# Patient Record
Sex: Female | Born: 1985 | Race: White | Hispanic: No | Smoking: Current every day smoker
Health system: Southern US, Community
[De-identification: ages and names within clinical notes are randomized; demographics above are authoritative.]

## PROBLEM LIST (undated history)

## (undated) DIAGNOSIS — K8020 Calculus of gallbladder without cholecystitis without obstruction: Secondary | ICD-10-CM

## (undated) DIAGNOSIS — F32A Depression, unspecified: Secondary | ICD-10-CM

## (undated) DIAGNOSIS — K219 Gastro-esophageal reflux disease without esophagitis: Secondary | ICD-10-CM

## (undated) DIAGNOSIS — F329 Major depressive disorder, single episode, unspecified: Secondary | ICD-10-CM

## (undated) DIAGNOSIS — L409 Psoriasis, unspecified: Secondary | ICD-10-CM

## (undated) HISTORY — PX: MOUTH SURGERY: SHX715

## (undated) HISTORY — DX: Psoriasis, unspecified: L40.9

---

## 2016-08-09 DIAGNOSIS — K8020 Calculus of gallbladder without cholecystitis without obstruction: Secondary | ICD-10-CM

## 2016-08-09 HISTORY — DX: Calculus of gallbladder without cholecystitis without obstruction: K80.20

## 2016-08-24 ENCOUNTER — Emergency Department: Payer: Managed Care, Other (non HMO)

## 2016-08-24 ENCOUNTER — Emergency Department
Admission: EM | Admit: 2016-08-24 | Discharge: 2016-08-24 | Disposition: A | Discharge status: Home / Self Care | Payer: Managed Care, Other (non HMO) | Attending: Emergency Medicine | Admitting: Emergency Medicine

## 2016-08-24 ENCOUNTER — Encounter: Payer: Self-pay | Admitting: Emergency Medicine

## 2016-08-24 DIAGNOSIS — R1011 Right upper quadrant pain: Secondary | ICD-10-CM | POA: Diagnosis not present

## 2016-08-24 DIAGNOSIS — K8080 Other cholelithiasis without obstruction: Secondary | ICD-10-CM | POA: Diagnosis not present

## 2016-08-24 DIAGNOSIS — M549 Dorsalgia, unspecified: Secondary | ICD-10-CM

## 2016-08-24 DIAGNOSIS — K8020 Calculus of gallbladder without cholecystitis without obstruction: Secondary | ICD-10-CM

## 2016-08-24 DIAGNOSIS — F17200 Nicotine dependence, unspecified, uncomplicated: Secondary | ICD-10-CM | POA: Insufficient documentation

## 2016-08-24 DIAGNOSIS — R1010 Upper abdominal pain, unspecified: Secondary | ICD-10-CM | POA: Diagnosis present

## 2016-08-24 LAB — POCT PREGNANCY, URINE: Preg Test, Ur: NEGATIVE

## 2016-08-24 LAB — CBC
HCT: 43.1 % (ref 35.0–47.0)
HEMOGLOBIN: 14.6 g/dL (ref 12.0–16.0)
MCH: 30.4 pg (ref 26.0–34.0)
MCHC: 33.8 g/dL (ref 32.0–36.0)
MCV: 89.8 fL (ref 80.0–100.0)
Platelets: 359 10*3/uL (ref 150–440)
RBC: 4.8 MIL/uL (ref 3.80–5.20)
RDW: 13.1 % (ref 11.5–14.5)
WBC: 11.9 10*3/uL — AB (ref 3.6–11.0)

## 2016-08-24 LAB — HEPATIC FUNCTION PANEL
ALBUMIN: 4.3 g/dL (ref 3.5–5.0)
ALT: 30 U/L (ref 14–54)
AST: 20 U/L (ref 15–41)
Alkaline Phosphatase: 56 U/L (ref 38–126)
Bilirubin, Direct: <0.1 mg/dL — ABNORMAL LOW (ref 0.1–0.5)
TOTAL PROTEIN: 7.4 g/dL (ref 6.5–8.1)
Total Bilirubin: 0.4 mg/dL (ref 0.3–1.2)

## 2016-08-24 LAB — BASIC METABOLIC PANEL
ANION GAP: 8 (ref 5–15)
BUN: 11 mg/dL (ref 6–20)
CALCIUM: 9.5 mg/dL (ref 8.9–10.3)
CO2: 25 mmol/L (ref 22–32)
CREATININE: 0.76 mg/dL (ref 0.44–1.00)
Chloride: 104 mmol/L (ref 101–111)
GFR calc non Af Amer: >60 mL/min (ref >60)
Glucose, Bld: 126 mg/dL — ABNORMAL HIGH (ref 65–99)
Potassium: 3.3 mmol/L — ABNORMAL LOW (ref 3.5–5.1)
SODIUM: 137 mmol/L (ref 135–145)

## 2016-08-24 LAB — URINALYSIS, COMPLETE (UACMP) WITH MICROSCOPIC
BILIRUBIN URINE: NEGATIVE
Glucose, UA: NEGATIVE mg/dL
KETONES UR: NEGATIVE mg/dL
NITRITE: NEGATIVE
PH: 5 (ref 5.0–8.0)
PROTEIN: 30 mg/dL — AB
Specific Gravity, Urine: 1.023 (ref 1.005–1.030)

## 2016-08-24 LAB — TROPONIN I

## 2016-08-24 LAB — LIPASE, BLOOD: LIPASE: 28 U/L (ref 11–51)

## 2016-08-24 MED ORDER — MORPHINE SULFATE (PF) 4 MG/ML IV SOLN
4.0000 mg | Freq: Once | INTRAVENOUS | Status: AC
  Administered 2016-08-24: 4 mg via INTRAVENOUS
  Filled 2016-08-24: qty 1

## 2016-08-24 MED ORDER — SODIUM CHLORIDE 0.9 % IV BOLUS (SEPSIS)
1000.0000 mL | Freq: Once | INTRAVENOUS | Status: AC
  Administered 2016-08-24: 1000 mL via INTRAVENOUS

## 2016-08-24 MED ORDER — ONDANSETRON HCL 4 MG/2ML IJ SOLN
4.0000 mg | Freq: Once | INTRAMUSCULAR | Status: AC
  Administered 2016-08-24: 4 mg via INTRAVENOUS

## 2016-08-24 MED ORDER — ONDANSETRON HCL 4 MG PO TABS
4.0000 mg | ORAL_TABLET | Freq: Once | ORAL | Status: DC
  Filled 2016-08-24: qty 1

## 2016-08-24 NOTE — ED Notes (Signed)
Pt given water and crackers  

## 2016-08-24 NOTE — ED Triage Notes (Signed)
Pt presents ambulatory to ED with c/o chest pain which started around 8 months ago and has been increasing in frequency since that time. Pt denies other cardiac symptoms. Pt is in NAD at this time.

## 2016-08-24 NOTE — ED Provider Notes (Addendum)
[facility] Emergency Department Provider Note  ____________________________________________   First MD Initiated Contact with Patient 08/24/16 [phone]     (approximate)  I have reviewed the triage vital signs and the nursing notes.   HISTORY  Chief Complaint Chest Pain   HPI Leslie Moyer is a 31 y.o. female without any chronic medical conditions was presenting to the emergency department today with upper abdominal pain radiating into her chest and back. She says the pain is severe and sharp and constant over the past 2 hours. She says the pain is been off and on over the past 8 months. She has tried Tums as well as Protonix without relief. Says that the pain sometimes does radiate up into her chest and cause shortness of breath. However, there are no exertional symptoms and the patient says the pain may come on at any time. No nausea or vomiting. Patient says that it does not worsen after eating. Denies any cardiac history or history in her family of cardiac disease at young ages. The patient does smoke but denies any drinking or drug use.   History reviewed. No pertinent past medical history.  There are no active problems to display for this patient.   History reviewed. No pertinent surgical history.  Prior to Admission medications   Not on File    Allergies Patient has no known allergies.  No family history on file.  Social History Social History  Substance Use Topics  . Smoking status: Current Every Day Smoker    Packs/day: 0.50  . Smokeless tobacco: Never Used  . Alcohol use Yes     Comment: rarely    Review of Systems  Constitutional: No fever/chills Eyes: No visual changes. ENT: No sore throat. Cardiovascular: as above Respiratory: as above Gastrointestinal:   No nausea, no vomiting.  No diarrhea.  No constipation. Genitourinary: Negative for dysuria. Musculoskeletal: Negative for back pain. Skin: Negative for  rash. Neurological: Negative for headaches, focal weakness or numbness.   ____________________________________________   PHYSICAL EXAM:  VITAL SIGNS: ED Triage Vitals  Enc Vitals Group     BP 08/24/16 0656 (!) 149/110     Pulse Rate 08/24/16 0656 74     Resp 08/24/16 0656 18     Temp 08/24/16 0656 98.2 F (36.8 C)     Temp Source 08/24/16 0656 Oral     SpO2 08/24/16 0656 100 %     Weight 08/24/16 0652 210 lb (95.3 kg)     Height 08/24/16 0652 5\' 6"  (1.676 m)     Head Circumference --      Peak Flow --      Pain Score 08/24/16 0652 10     Pain Loc --      Pain Edu? --      Excl. in GC? --     Constitutional: Alert and oriented. Pt appears uncomfortable Eyes: Conjunctivae are normal.  Head: Atraumatic. Nose: No congestion/rhinnorhea. Mouth/Throat: Mucous membranes are moist.  Neck: No stridor.   Cardiovascular: Normal rate, regular rhythm. Grossly normal heart sounds.   Respiratory: Normal respiratory effort.  No retractions. Lungs CTAB. Gastrointestinal: Right upper quadrant tenderness palpation with a negative Murphy sign. Tenderness is moderate to severe. No distention.  Musculoskeletal: No lower extremity tenderness nor edema.  No joint effusions. Neurologic:  Normal speech and language. No gross focal neurologic deficits are appreciated. Skin:  Skin is warm, dry and intact. No rash noted. Psychiatric: Mood and affect are normal. Speech and behavior are  normal.  ____________________________________________   LABS (all labs ordered are listed, but only abnormal results are displayed)  Labs Reviewed  BASIC METABOLIC PANEL  CBC  TROPONIN I  LIPASE, BLOOD  HEPATIC FUNCTION PANEL  URINALYSIS, COMPLETE (UACMP) WITH MICROSCOPIC  POC URINE PREG, ED   ____________________________________________  EKG  ED ECG REPORT I, Norvell Ureste,  Teena Irani, the attending physician, personally viewed and interpreted this ECG.   Date: 08/24/2016  EKG Time: 0654  Rate: 77   Rhythm: normal sinus rhythm  Axis: normal  Intervals:none  ST&T Change: No ST segment elevation or depression. No abnormal T-wave inversion.  ____________________________________________  RADIOLOGY  No acute finding on the chest x-ray  Cholelithiasis on ultrasound. CAT scan without any nephrolithiasis. Found to have what appears to be a benign left adrenal adenoma. ____________________________________________   PROCEDURES  Procedure(s) performed:   Procedures  Critical Care performed:   ____________________________________________   INITIAL IMPRESSION / ASSESSMENT AND PLAN / ED COURSE  Pertinent labs & imaging results that were available during my care of the patient were reviewed by me and considered in my medical decision making (see chart for details).  ----------------------------------------- 11:43 AM on 08/24/2016 -----------------------------------------  Patient was evaluated by Dr. Tonita Cong who believes the patient is appropriate for follow-up in office and arrange for an appointment this Monday 10:30 AM. The patient is pain-free at this time. I was able to reevaluate her with only mild right upper quadrant tenderness palpation. She vomited 1 in the emergency Department but now, after morphine and Zofran and is able to tolerate by mouth fluids and solids. Patient will be discharged home at this time. Likely abdominal pain radiating to the chest. Unlikely to be PE or cardiac disease. Patient denies any urinary complaints including any frequency, burning or lower abdominal pain. We will send the urinalysis for culture. We will not give antibiotics at this time.      ____________________________________________   FINAL CLINICAL IMPRESSION(S) / ED DIAGNOSES  Symptomatic cholelithiasis.    NEW MEDICATIONS STARTED DURING THIS VISIT:  New Prescriptions   No medications on file     Note:  This document was prepared using Dragon voice recognition software and  may include unintentional dictation errors.     Myrna Blazer, MD 08/24/16 1144  Patient offered pain medications but does not want them. 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    Myrna Blazer, MD 08/24/16 1145

## 2016-08-24 NOTE — Consult Note (Signed)
Patient ID: Leslie Moyer, female   DOB: 10/07/1985, 31 y.o.   MRN: [id number]  CC: Abdominal pain  HPI Leslie HazelCaralie Counsell is a 31 y.o. female who presents to the emergency department for evaluation of abdominal pain. Patient reports over the last 8 months she's had intermittent pains in her midepigastric or right upper quadrant. It appears to be happening more frequently over the last month. However, the time of my consultation she states her pain has resolved. She had 1 episode of nausea and vomiting after coming to the hospital which she reports happened after receiving a dose of morphine. She reports no other nausea and vomiting outside of this. She denies any fevers, chills, chest pain, shortness breath, diarrhea, constipation. She cannot report of actual food trigger but reports that she had chicken Parmesan last night approximately 6 hours before the onset of pain. She describes the pain as a stabbing sensation that comes and goes. She is otherwise in her usual state of health and states something disease be done about this because it is getting more frequent.  HPI  Medical history: Currently on no medications  Surgical history: Prior oral surgery but no prior abdominal surgery  Family history: Remote family history of breast cancer but no first-degree relatives with any cancers, heart disease, diabetes.  Social History Social History  Substance Use Topics  . Smoking status: Current Every Day Smoker    Packs/day: 0.50  . Smokeless tobacco: Never Used  . Alcohol use Yes     Comment: rarely    No Known Allergies  No current facility-administered medications for this encounter.    Current Outpatient Prescriptions  Medication Sig Dispense Refill  . acetaminophen (TYLENOL) 500 MG tablet Take 500 mg by mouth every 6 (six) hours as needed.    . calcium carbonate (TUMS - DOSED IN MG ELEMENTAL CALCIUM) 500 MG chewable tablet Chew 1 tablet by mouth as needed for indigestion or heartburn.        Review of Systems A Multi-point review of systems was asked and was negative except for the findings documented in the history of present illness  Physical Exam Blood pressure 128/74, pulse 78, temperature 98.2 F (36.8 C), temperature source Oral, resp. rate 17, height 5\' 6"  (1.676 m), weight 95.3 kg (210 lb), last menstrual period 07/24/2016, SpO2 97 %. CONSTITUTIONAL: No acute distress. EYES: Pupils are equal, round, and reactive to light, Sclera are non-icteric. EARS, NOSE, MOUTH AND THROAT: The oropharynx is clear. The oral mucosa is pink and moist. Hearing is intact to voice. LYMPH NODES:  Lymph nodes in the neck are normal. RESPIRATORY:  Lungs are clear. There is normal respiratory effort, with equal breath sounds bilaterally, and without pathologic use of accessory muscles. CARDIOVASCULAR: Heart is regular without murmurs, gallops, or rubs. GI: The abdomen is soft, nontender, and nondistended. There are no palpable masses. There is no hepatosplenomegaly. There are normal bowel sounds in all quadrants. GU: Rectal deferred.   MUSCULOSKELETAL: Normal muscle strength and tone. No cyanosis or edema.   SKIN: Turgor is good and there are no pathologic skin lesions or ulcers. NEUROLOGIC: Motor and sensation is grossly normal. Cranial nerves are grossly intact. PSYCH:  Oriented to person, place and time. Affect is normal.  Data Reviewed Images and labs reviewed. Labs show a mild leukocytosis of 11.9 but are otherwise within normal limits including normal LFTs. Ultrasound the right upper quadrant shows evidence of cholelithiasis but no gallbladder wall thickening, pericholecystic fluid, ductal dilatation. CT scan of her  renal stone protocol shows a normal gallbladder as well as well as no evidence of kidney stones. I have personally reviewed the patient's imaging, laboratory findings and medical records.    Assessment    Symptomatic cholelithiasis    Plan    31 year old female  with symptomatic cholelithiasis. Per the history of her symptoms becoming more frequent I recommended close outpatient follow-up so that an elective laparoscopic cholecystectomy can be scheduled. Discussed the signs and symptoms of cholecystitis in detail and for her to return to the emergency department should her pain return without relief or should she no longer be able to maintain hydration at home. Otherwise we will see her in clinic on Monday morning with my partner Dr. Everlene Farrier for further evaluation and to schedule her outpatient cholecystectomy. The plan was discussed in detail with the ER physician we will give the patient a by mouth trial prior to her being discharged from the ER. Patient agrees with this plan.     Time spent with the patient was 45 minutes, with more than 50% of the time spent in face-to-face education, counseling and care coordination.     Ricarda Frame, MD FACS General Surgeon 08/24/2016, 10:28 AM

## 2016-08-26 ENCOUNTER — Telehealth: Payer: Self-pay | Admitting: Surgery

## 2016-08-26 ENCOUNTER — Ambulatory Visit (INDEPENDENT_AMBULATORY_CARE_PROVIDER_SITE_OTHER): Payer: Managed Care, Other (non HMO) | Admitting: Surgery

## 2016-08-26 ENCOUNTER — Encounter: Payer: Self-pay | Admitting: Surgery

## 2016-08-26 VITALS — BP 135/89 | HR 96 | Temp 97.8°F | Ht 66.0 in | Wt 220.4 lb

## 2016-08-26 DIAGNOSIS — K8020 Calculus of gallbladder without cholecystitis without obstruction: Secondary | ICD-10-CM

## 2016-08-26 LAB — URINE CULTURE

## 2016-08-26 NOTE — Telephone Encounter (Signed)
Pt advised of pre op date/time and sx date. Sx: 08/28/16 with Dr Pabon--Laparoscopic cholecystectomy.  Pre op: 08/27/16 between 1-5:00pm--Phone.   Patient made aware to call [phone], between 1-3:00pm the day before surgery, to find out what time to arrive.

## 2016-08-26 NOTE — Patient Instructions (Signed)
We have your surgery scheduled for 08/28/16 at [facility] with Dr.Pabon. Please see your blue pre-care sheet for surgery information. Please call our office if you have any questions or concerns.

## 2016-08-26 NOTE — Progress Notes (Signed)
Patient ID: Leslie Moyer, female   DOB: Jan 02, 1986, 31 y.o.   MRN: [id number]  HPI Linsey Arteaga is a 31 y.o. female with an 8 month hx of right upper quadrant pain nausea and vomiting. She reports that the pain is intermittent moderate in intensity. No evidence of jaundice or cholangitis. No fevers or chills. Pain seems to be worsening with heavy meals. No specific alleviating factors  Last acute attack was 2 days ago that prompted her to the emergency room and where ultrasound of the right upper quadrant was performed and I have personally reviewed, there is evidence of cholelithiasis without cholecystitis. Normal common bile duct. CT scan reviewed personally as well showing no acute intra-abdominal or myalgias other than an small adrenal adenoma. No previous abdominal operations. She is able to perform more than 4 Mets of activity without any shortness of breath or chest pain WBC slight elevation and nml LFTs  HPI  Past Medical History:  Diagnosis Date  . Psoriasis     Past Surgical History:  Procedure Laterality Date  . MOUTH SURGERY      Family History  Problem Relation Age of Onset  . Breast cancer Maternal Grandmother     Social History Social History  Substance Use Topics  . Smoking status: Current Every Day Smoker    Packs/day: 0.50  . Smokeless tobacco: Never Used  . Alcohol use Yes     Comment: rarely    No Known Allergies  No current outpatient prescriptions on file.   No current facility-administered medications for this visit.      Review of Systems Full ROS  was asked and was negative except for the information on the HPI  Physical Exam Blood pressure 135/89, pulse 96, temperature 97.8 F (36.6 C), temperature source Oral, height 5\' 6"  (1.676 m), weight 100 kg (220 lb 6.4 oz). CONSTITUTIONAL: NAD EYES: Pupils are equal, round, and reactive to light, Sclera are non-icteric. EARS, NOSE, MOUTH AND THROAT: The oropharynx is clear. The oral mucosa is  pink and moist. Hearing is intact to voice. LYMPH NODES:  Lymph nodes in the neck are normal. RESPIRATORY:  Lungs are clear. There is normal respiratory effort, with equal breath sounds bilaterally, and without pathologic use of accessory muscles. CARDIOVASCULAR: Heart is regular without murmurs, gallops, or rubs. GI: The abdomen is  soft, mild TTP RUQ, negative murphy. There is no hepatosplenomegaly. There are normal bowel sounds in all quadrants. GU: Rectal deferred.   MUSCULOSKELETAL: Normal muscle strength and tone. No cyanosis or edema.   SKIN: Turgor is good and there are no pathologic skin lesions or ulcers. NEUROLOGIC: Motor and sensation is grossly normal. Cranial nerves are grossly intact. PSYCH:  Oriented to person, place and time. Affect is normal.  Data Reviewed I have personally reviewed the patient's imaging, laboratory findings and medical records.    Assessment/Plan Symptomatic Cholelithiasis vs chronic cholecystitis. I do recommend cholecystectomy given her sxs. The risks, benefits, complications, treatment options, and expected outcomes were discussed with the patient. The possibilities of bleeding, recurrent infection, finding a normal gallbladder, perforation of viscus organs, damage to surrounding structures, bile leak, abscess formation, needing a drain placed, the need for additional procedures, reaction to medication, pulmonary aspiration,  failure to diagnose a condition, the possible need to convert to an open procedure, and creating a complication requiring transfusion or operation were discussed with the patient. The patient and/or family concurred with the proposed plan, giving informed consent.   Sterling Big, MD [facility] General Surgeon  08/26/2016, 11:01 AM

## 2016-08-27 ENCOUNTER — Encounter
Admission: RE | Admit: 2016-08-27 | Discharge: 2016-08-27 | Disposition: A | Discharge status: Home / Self Care | Payer: Managed Care, Other (non HMO) | Source: Ambulatory Visit | Attending: Surgery | Admitting: Surgery

## 2016-08-27 HISTORY — DX: Gastro-esophageal reflux disease without esophagitis: K21.9

## 2016-08-27 HISTORY — DX: Depression, unspecified: F32.A

## 2016-08-27 HISTORY — DX: Major depressive disorder, single episode, unspecified: F32.9

## 2016-08-27 NOTE — Patient Instructions (Signed)
  Your procedure is scheduled on: 08-28-16 Report to Same Day Surgery 2nd floor medical mall [facility](Medical Mall Entrance-take elevator on left to 2nd floor.  Check in with surgery information desk.) @ 11 AM PER PT   Remember: Instructions that are not followed completely may result in serious medical risk, up to and including death, or upon the discretion of your surgeon and anesthesiologist your surgery may need to be rescheduled.    _x___ 1. Do not eat food or drink liquids after midnight. No gum chewing or hard candies.     __x__ 2. No Alcohol for 24 hours before or after surgery.   __x__3. No Smoking for 24 prior to surgery.   ____  4. Bring all medications with you on the day of surgery if instructed.    __x__ 5. Notify your doctor if there is any change in your medical condition     (cold, fever, infections).     Do not wear jewelry, make-up, hairpins, clips or nail polish.  Do not wear lotions, powders, or perfumes. You may wear deodorant.  Do not shave 48 hours prior to surgery. Men may shave face and neck.  Do not bring valuables to the hospital.    [facility] is not responsible for any belongings or valuables.               Contacts, dentures or bridgework may not be worn into surgery.  Leave your suitcase in the car. After surgery it may be brought to your room.  For patients admitted to the hospital, discharge time is determined by your                       treatment team.   Patients discharged the day of surgery will not be allowed to drive home.  You will need someone to drive you home and stay with you the night of your procedure.    Please read over the following fact sheets that you were given:   [facility] Preparing for Surgery and or MRSA Information   ____ Take anti-hypertensive (unless it includes a diuretic), cardiac, seizure, asthma,     anti-reflux and psychiatric medicines. These include:  1. NONE  2.  3.  4.  5.  6.  ____Fleets enema or Magnesium Citrate  as directed.   ____ Use CHG Soap or sage wipes as directed on instruction sheet   ____ Use inhalers on the day of surgery and bring to hospital day of surgery  ____ Stop Metformin and Janumet 2 days prior to surgery.    ____ Take 1/2 of usual insulin dose the night before surgery and none on the morning     surgery.   ____ Follow recommendations from Cardiologist, Pulmonologist or PCP regarding  stopping Aspirin, Coumadin, Pllavix ,Eliquis, Effient, or Pradaxa, and Pletal.  X____Stop Anti-inflammatories such as Advil, Aleve, IBUPROFEN, Motrin, Naproxen, Naprosyn, Goodies powders or aspirin products NOW-OK to take Tylenol    ____ Stop supplements until after surgery   ____ Bring C-Pap to the hospital.

## 2016-08-28 ENCOUNTER — Ambulatory Visit: Payer: Managed Care, Other (non HMO) | Admitting: Anesthesiology

## 2016-08-28 ENCOUNTER — Encounter
Admission: RE | Disposition: A | Discharge status: Home / Self Care | Payer: Self-pay | Source: Ambulatory Visit | Attending: Surgery

## 2016-08-28 ENCOUNTER — Ambulatory Visit
Admission: RE | Admit: 2016-08-28 | Discharge: 2016-08-28 | Disposition: A | Discharge status: Home / Self Care | Payer: Managed Care, Other (non HMO) | Source: Ambulatory Visit | Attending: Surgery | Admitting: Surgery

## 2016-08-28 ENCOUNTER — Encounter: Payer: Self-pay | Admitting: *Deleted

## 2016-08-28 DIAGNOSIS — K8050 Calculus of bile duct without cholangitis or cholecystitis without obstruction: Secondary | ICD-10-CM | POA: Diagnosis present

## 2016-08-28 DIAGNOSIS — Z803 Family history of malignant neoplasm of breast: Secondary | ICD-10-CM | POA: Diagnosis not present

## 2016-08-28 DIAGNOSIS — F17210 Nicotine dependence, cigarettes, uncomplicated: Secondary | ICD-10-CM | POA: Insufficient documentation

## 2016-08-28 DIAGNOSIS — K8020 Calculus of gallbladder without cholecystitis without obstruction: Secondary | ICD-10-CM | POA: Insufficient documentation

## 2016-08-28 DIAGNOSIS — K8010 Calculus of gallbladder with chronic cholecystitis without obstruction: Secondary | ICD-10-CM | POA: Diagnosis not present

## 2016-08-28 HISTORY — PX: CHOLECYSTECTOMY: SHX55

## 2016-08-28 HISTORY — DX: Calculus of gallbladder without cholecystitis without obstruction: K80.20

## 2016-08-28 LAB — POCT I-STAT 4, (NA,K, GLUC, HGB,HCT)
Glucose, Bld: 100 mg/dL — ABNORMAL HIGH (ref 65–99)
HEMATOCRIT: 42 % (ref 36.0–46.0)
HEMOGLOBIN: 14.3 g/dL (ref 12.0–15.0)
POTASSIUM: 4 mmol/L (ref 3.5–5.1)
SODIUM: 140 mmol/L (ref 135–145)

## 2016-08-28 LAB — POCT PREGNANCY, URINE: Preg Test, Ur: NEGATIVE

## 2016-08-28 SURGERY — LAPAROSCOPIC CHOLECYSTECTOMY
Anesthesia: General | Wound class: Clean Contaminated

## 2016-08-28 MED ORDER — FENTANYL CITRATE (PF) 100 MCG/2ML IJ SOLN
INTRAMUSCULAR | Status: AC
  Filled 2016-08-28: qty 2

## 2016-08-28 MED ORDER — FAMOTIDINE 20 MG PO TABS
20.0000 mg | ORAL_TABLET | Freq: Once | ORAL | Status: AC
  Administered 2016-08-28: 20 mg via ORAL

## 2016-08-28 MED ORDER — SUGAMMADEX SODIUM 200 MG/2ML IV SOLN
INTRAVENOUS | Status: AC
Start: 2016-08-28 — End: ?
  Filled 2016-08-28: qty 2

## 2016-08-28 MED ORDER — HYDROCODONE-ACETAMINOPHEN 5-325 MG PO TABS
ORAL_TABLET | ORAL | Status: AC
  Filled 2016-08-28: qty 1

## 2016-08-28 MED ORDER — PROPOFOL 10 MG/ML IV BOLUS
INTRAVENOUS | Status: DC | PRN
  Administered 2016-08-28: 180 mg via INTRAVENOUS

## 2016-08-28 MED ORDER — HYDROCODONE-ACETAMINOPHEN 5-325 MG PO TABS
1.0000 | ORAL_TABLET | ORAL | Status: DC | PRN
  Administered 2016-08-28: 1 via ORAL

## 2016-08-28 MED ORDER — FENTANYL CITRATE (PF) 100 MCG/2ML IJ SOLN
INTRAMUSCULAR | Status: DC | PRN
  Administered 2016-08-28: 50 ug via INTRAVENOUS
  Administered 2016-08-28 (×2): 100 ug via INTRAVENOUS
  Administered 2016-08-28: 50 ug via INTRAVENOUS

## 2016-08-28 MED ORDER — MIDAZOLAM HCL 2 MG/2ML IJ SOLN
INTRAMUSCULAR | Status: DC | PRN
  Administered 2016-08-28: 2 mg via INTRAVENOUS

## 2016-08-28 MED ORDER — KETOROLAC TROMETHAMINE 30 MG/ML IJ SOLN
INTRAMUSCULAR | Status: DC | PRN
  Administered 2016-08-28: 30 mg via INTRAVENOUS

## 2016-08-28 MED ORDER — BUPIVACAINE-EPINEPHRINE 0.25% -1:200000 IJ SOLN
INTRAMUSCULAR | Status: DC | PRN
  Administered 2016-08-28: 30 mL

## 2016-08-28 MED ORDER — CHLORHEXIDINE GLUCONATE CLOTH 2 % EX PADS
6.0000 | MEDICATED_PAD | Freq: Once | CUTANEOUS | Status: AC
  Administered 2016-08-28: 6 via TOPICAL

## 2016-08-28 MED ORDER — ROCURONIUM BROMIDE 50 MG/5ML IV SOLN
INTRAVENOUS | Status: AC
  Filled 2016-08-28: qty 1

## 2016-08-28 MED ORDER — PROMETHAZINE HCL 25 MG/ML IJ SOLN: 6.2500 mg | INTRAMUSCULAR | Status: DC | PRN

## 2016-08-28 MED ORDER — DEXAMETHASONE SODIUM PHOSPHATE 10 MG/ML IJ SOLN
INTRAMUSCULAR | Status: AC
  Filled 2016-08-28: qty 1

## 2016-08-28 MED ORDER — LIDOCAINE HCL (CARDIAC) 20 MG/ML IV SOLN
INTRAVENOUS | Status: DC | PRN
  Administered 2016-08-28: 80 mg via INTRAVENOUS

## 2016-08-28 MED ORDER — CEFAZOLIN SODIUM-DEXTROSE 2-4 GM/100ML-% IV SOLN
INTRAVENOUS | Status: AC
  Filled 2016-08-28: qty 100

## 2016-08-28 MED ORDER — SODIUM CHLORIDE 0.9 % IJ SOLN
INTRAMUSCULAR | Status: AC
  Filled 2016-08-28: qty 50

## 2016-08-28 MED ORDER — ONDANSETRON HCL 4 MG/2ML IJ SOLN
INTRAMUSCULAR | Status: DC | PRN
Start: 2016-08-28 — End: 2016-08-28
  Administered 2016-08-28: 4 mg via INTRAVENOUS

## 2016-08-28 MED ORDER — DEXAMETHASONE SODIUM PHOSPHATE 10 MG/ML IJ SOLN
INTRAMUSCULAR | Status: DC | PRN
  Administered 2016-08-28: 10 mg via INTRAVENOUS

## 2016-08-28 MED ORDER — LACTATED RINGERS IV SOLN
INTRAVENOUS | Status: DC
  Administered 2016-08-28: 12:00:00 via INTRAVENOUS

## 2016-08-28 MED ORDER — HYDROCODONE-ACETAMINOPHEN 5-325 MG PO TABS: 1.0000 | ORAL_TABLET | ORAL | 0 refills | Status: AC | PRN

## 2016-08-28 MED ORDER — SUGAMMADEX SODIUM 200 MG/2ML IV SOLN
INTRAVENOUS | Status: DC | PRN
  Administered 2016-08-28: 200 mg via INTRAVENOUS

## 2016-08-28 MED ORDER — CHLORHEXIDINE GLUCONATE CLOTH 2 % EX PADS: 6.0000 | MEDICATED_PAD | Freq: Once | CUTANEOUS | Status: DC

## 2016-08-28 MED ORDER — ONDANSETRON HCL 4 MG/2ML IJ SOLN
INTRAMUSCULAR | Status: AC
  Filled 2016-08-28: qty 2

## 2016-08-28 MED ORDER — FAMOTIDINE 20 MG PO TABS
ORAL_TABLET | ORAL | Status: AC
  Filled 2016-08-28: qty 1

## 2016-08-28 MED ORDER — MIDAZOLAM HCL 2 MG/2ML IJ SOLN
INTRAMUSCULAR | Status: AC
  Filled 2016-08-28: qty 2

## 2016-08-28 MED ORDER — ROCURONIUM BROMIDE 100 MG/10ML IV SOLN
INTRAVENOUS | Status: DC | PRN
  Administered 2016-08-28: 40 mg via INTRAVENOUS

## 2016-08-28 MED ORDER — BUPIVACAINE-EPINEPHRINE (PF) 0.25% -1:200000 IJ SOLN
INTRAMUSCULAR | Status: AC
  Filled 2016-08-28: qty 30

## 2016-08-28 MED ORDER — FENTANYL CITRATE (PF) 100 MCG/2ML IJ SOLN
25.0000 ug | INTRAMUSCULAR | Status: DC | PRN
  Administered 2016-08-28 (×2): 50 ug via INTRAVENOUS

## 2016-08-28 MED ORDER — FENTANYL CITRATE (PF) 100 MCG/2ML IJ SOLN
INTRAMUSCULAR | Status: AC
  Administered 2016-08-28: 50 ug via INTRAVENOUS
  Filled 2016-08-28: qty 2

## 2016-08-28 MED ORDER — CEFAZOLIN SODIUM-DEXTROSE 2-4 GM/100ML-% IV SOLN
2.0000 g | INTRAVENOUS | Status: AC
  Administered 2016-08-28: 2 g via INTRAVENOUS

## 2016-08-28 MED ORDER — PROPOFOL 10 MG/ML IV BOLUS
INTRAVENOUS | Status: AC
  Filled 2016-08-28: qty 20

## 2016-08-28 SURGICAL SUPPLY — 47 items
APPLICATOR COTTON TIP 6IN STRL (MISCELLANEOUS) IMPLANT
APPLIER CLIP 5 13 M/L LIGAMAX5 (MISCELLANEOUS) ×3
BLADE SURG 15 STRL LF DISP TIS (BLADE) ×1 IMPLANT
BLADE SURG 15 STRL SS (BLADE) ×2
CANISTER SUCT 1200ML W/VALVE (MISCELLANEOUS) ×3 IMPLANT
CHLORAPREP W/TINT 26ML (MISCELLANEOUS) ×3 IMPLANT
CHOLANGIOGRAM CATH TAUT (CATHETERS) IMPLANT
CLEANER CAUTERY TIP 5X5 PAD (MISCELLANEOUS) ×1 IMPLANT
CLIP APPLIE 5 13 M/L LIGAMAX5 (MISCELLANEOUS) ×1 IMPLANT
DECANTER SPIKE VIAL GLASS SM (MISCELLANEOUS) ×3 IMPLANT
DERMABOND ADVANCED (GAUZE/BANDAGES/DRESSINGS) ×2
DERMABOND ADVANCED .7 DNX12 (GAUZE/BANDAGES/DRESSINGS) ×1 IMPLANT
DRAPE C-ARM XRAY 36X54 (DRAPES) IMPLANT
ELECT CAUTERY BLADE 6.4 (BLADE) ×3 IMPLANT
ELECT REM PT RETURN 9FT ADLT (ELECTROSURGICAL) ×3
ELECTRODE REM PT RTRN 9FT ADLT (ELECTROSURGICAL) ×1 IMPLANT
ENDOPOUCH RETRIEVER 10 (MISCELLANEOUS) ×3 IMPLANT
GLOVE BIO SURGEON STRL SZ7 (GLOVE) ×9 IMPLANT
GLOVE BIOGEL PI IND STRL 7.5 (GLOVE) ×3 IMPLANT
GLOVE BIOGEL PI INDICATOR 7.5 (GLOVE) ×6
GOWN STRL REUS W/ TWL LRG LVL3 (GOWN DISPOSABLE) ×3 IMPLANT
GOWN STRL REUS W/TWL LRG LVL3 (GOWN DISPOSABLE) ×6
IRRIGATION STRYKERFLOW (MISCELLANEOUS) ×1 IMPLANT
IRRIGATOR STRYKERFLOW (MISCELLANEOUS) ×3
IV CATH ANGIO 12GX3 LT BLUE (NEEDLE) IMPLANT
IV NS 1000ML (IV SOLUTION) ×2
IV NS 1000ML BAXH (IV SOLUTION) ×1 IMPLANT
L-HOOK LAP DISP 36CM (ELECTROSURGICAL) ×3
LHOOK LAP DISP 36CM (ELECTROSURGICAL) ×1 IMPLANT
NEEDLE HYPO 22GX1.5 SAFETY (NEEDLE) ×3 IMPLANT
PACK LAP CHOLECYSTECTOMY (MISCELLANEOUS) ×3 IMPLANT
PAD CLEANER CAUTERY TIP 5X5 (MISCELLANEOUS) ×2
PENCIL ELECTRO HAND CTR (MISCELLANEOUS) ×3 IMPLANT
SCISSORS METZENBAUM CVD 33 (INSTRUMENTS) ×3 IMPLANT
SLEEVE ENDOPATH XCEL 5M (ENDOMECHANICALS) ×6 IMPLANT
SOL ANTI-FOG 6CC FOG-OUT (MISCELLANEOUS) ×1 IMPLANT
SOL FOG-OUT ANTI-FOG 6CC (MISCELLANEOUS) ×2
SPONGE LAP 18X18 5 PK (GAUZE/BANDAGES/DRESSINGS) ×3 IMPLANT
STOPCOCK 4 WAY LG BORE MALE ST (IV SETS) IMPLANT
SUT ETHIBOND 0 MO6 C/R (SUTURE) IMPLANT
SUT MNCRL AB 4-0 PS2 18 (SUTURE) ×3 IMPLANT
SUT VICRYL 0 AB UR-6 (SUTURE) ×6 IMPLANT
SYR 20CC LL (SYRINGE) IMPLANT
TROCAR XCEL BLUNT TIP 100MML (ENDOMECHANICALS) ×3 IMPLANT
TROCAR XCEL NON-BLD 5MMX100MML (ENDOMECHANICALS) ×3 IMPLANT
TUBING INSUFFLATOR HI FLOW (MISCELLANEOUS) ×3 IMPLANT
WATER STERILE IRR 1000ML POUR (IV SOLUTION) ×3 IMPLANT

## 2016-08-28 NOTE — H&P (View-Only) (Signed)
Patient ID: Leslie Moyer, female   DOB: Jan 02, 1986, 31 y.o.   MRN: [id number]  HPI Leslie Moyer is a 31 y.o. female with an 8 month hx of right upper quadrant pain nausea and vomiting. She reports that the pain is intermittent moderate in intensity. No evidence of jaundice or cholangitis. No fevers or chills. Pain seems to be worsening with heavy meals. No specific alleviating factors  Last acute attack was 2 days ago that prompted her to the emergency room and where ultrasound of the right upper quadrant was performed and I have personally reviewed, there is evidence of cholelithiasis without cholecystitis. Normal common bile duct. CT scan reviewed personally as well showing no acute intra-abdominal or myalgias other than an small adrenal adenoma. No previous abdominal operations. She is able to perform more than 4 Mets of activity without any shortness of breath or chest pain WBC slight elevation and nml LFTs  HPI  Past Medical History:  Diagnosis Date  . Psoriasis     Past Surgical History:  Procedure Laterality Date  . MOUTH SURGERY      Family History  Problem Relation Age of Onset  . Breast cancer Maternal Grandmother     Social History Social History  Substance Use Topics  . Smoking status: Current Every Day Smoker    Packs/day: 0.50  . Smokeless tobacco: Never Used  . Alcohol use Yes     Comment: rarely    No Known Allergies  No current outpatient prescriptions on file.   No current facility-administered medications for this visit.      Review of Systems Full ROS  was asked and was negative except for the information on the HPI  Physical Exam Blood pressure 135/89, pulse 96, temperature 97.8 F (36.6 C), temperature source Oral, height 5\' 6"  (1.676 m), weight 100 kg (220 lb 6.4 oz). CONSTITUTIONAL: NAD EYES: Pupils are equal, round, and reactive to light, Sclera are non-icteric. EARS, NOSE, MOUTH AND THROAT: The oropharynx is clear. The oral mucosa is  pink and moist. Hearing is intact to voice. LYMPH NODES:  Lymph nodes in the neck are normal. RESPIRATORY:  Lungs are clear. There is normal respiratory effort, with equal breath sounds bilaterally, and without pathologic use of accessory muscles. CARDIOVASCULAR: Heart is regular without murmurs, gallops, or rubs. GI: The abdomen is  soft, mild TTP RUQ, negative murphy. There is no hepatosplenomegaly. There are normal bowel sounds in all quadrants. GU: Rectal deferred.   MUSCULOSKELETAL: Normal muscle strength and tone. No cyanosis or edema.   SKIN: Turgor is good and there are no pathologic skin lesions or ulcers. NEUROLOGIC: Motor and sensation is grossly normal. Cranial nerves are grossly intact. PSYCH:  Oriented to person, place and time. Affect is normal.  Data Reviewed I have personally reviewed the patient's imaging, laboratory findings and medical records.    Assessment/Plan Symptomatic Cholelithiasis vs chronic cholecystitis. I do recommend cholecystectomy given her sxs. The risks, benefits, complications, treatment options, and expected outcomes were discussed with the patient. The possibilities of bleeding, recurrent infection, finding a normal gallbladder, perforation of viscus organs, damage to surrounding structures, bile leak, abscess formation, needing a drain placed, the need for additional procedures, reaction to medication, pulmonary aspiration,  failure to diagnose a condition, the possible need to convert to an open procedure, and creating a complication requiring transfusion or operation were discussed with the patient. The patient and/or family concurred with the proposed plan, giving informed consent.   Sterling Big, MD [facility] General Surgeon  08/26/2016, 11:01 AM

## 2016-08-28 NOTE — Op Note (Signed)
Laparoscopic Cholecystectomy  Pre-operative Diagnosis: biliary colic  Post-operative Diagnosis: same  Procedure: lap chole  Surgeon: Sterling Bigiego Maytte Jacot, MD FACS  Anesthesia: Gen. with endotracheal tube  Findings: GS   Estimated Blood Loss: 10cc                 Specimens: Gallbladder           Complications: none   Procedure Details  The patient was seen again in the Holding Room. The benefits, complications, treatment options, and expected outcomes were discussed with the patient. The risks of bleeding, infection, recurrence of symptoms, failure to resolve symptoms, bile duct damage, bile duct leak, retained common bile duct stone, bowel injury, any of which could require further surgery and/or ERCP, stent, or papillotomy were reviewed with the patient. The likelihood of improving the patient's symptoms with return to their baseline status is good.  The patient and/or family concurred with the proposed plan, giving informed consent.  The patient was taken to Operating Room, identified as Tommye StandardCaralie G Friedli and the procedure verified as Laparoscopic Cholecystectomy.  A Time Out was held and the above information confirmed.  Prior to the induction of general anesthesia, antibiotic prophylaxis was administered. VTE prophylaxis was in place. General endotracheal anesthesia was then administered and tolerated well. After the induction, the abdomen was prepped with Chloraprep and draped in the sterile fashion. The patient was positioned in the supine position.  Cut down technique was used to enter the abdominal cavity and a Hasson trochar was placed after two vicryl stitches were anchored to the fascia. Pneumoperitoneum was then created with CO2 and tolerated well without any adverse changes in the patient's vital signs.  Three 5-mm ports were placed in the right upper quadrant all under direct vision. All skin incisions  were infiltrated with a local anesthetic agent before making the incision and  placing the trocars.   The patient was positioned  in reverse Trendelenburg, tilted slightly to the patient's left.  The gallbladder was identified, the fundus grasped and retracted cephalad. Adhesions were lysed bluntly. The infundibulum was grasped and retracted laterally, exposing the peritoneum overlying the triangle of Calot. This was then divided and exposed in a blunt fashion. An extended critical view of the cystic duct and cystic artery was obtained.  The cystic duct was clearly identified and bluntly dissected.   Artery and duct were double clipped and divided.  The gallbladder was taken from the gallbladder fossa in a retrograde fashion with the electrocautery. The gallbladder was removed and placed in an Endocatch bag. The liver bed was irrigated and inspected. Hemostasis was achieved with the electrocautery. Copious irrigation was utilized and was repeatedly aspirated until clear.  The gallbladder and Endocatch sac were then removed through a port site.    Inspection of the right upper quadrant was performed. No bleeding, bile duct injury or leak, or bowel injury was noted. Pneumoperitoneum was released.  The periumbilical port site was closed with interrumpted 0 Vicryl sutures. 4-0 subcuticular Monocryl was used to close the skin. Dermabond was  applied.  The patient was then extubated and brought to the recovery room in stable condition. Sponge, lap, and needle counts were correct at closure and at the conclusion of the case.               Sterling Bigiego Sherif Millspaugh, MD, FACS

## 2016-08-28 NOTE — Transfer of Care (Signed)
Immediate Anesthesia Transfer of Care Note  Patient: Leslie Moyer  Procedure(s) Performed: Procedure(s): LAPAROSCOPIC CHOLECYSTECTOMY (N/A)  Patient Location: PACU  Anesthesia Type:General  Level of Consciousness: awake, alert  and oriented  Airway & Oxygen Therapy: Patient Spontanous Breathing and Patient connected to face mask oxygen  Post-op Assessment: Report given to RN and Post -op Vital signs reviewed and stable  Post vital signs: Reviewed and stable  Last Vitals:  Vitals:   08/28/16 1116 08/28/16 1309  BP: 121/71 (!) 143/98  Pulse: 95 97  Resp: 14 17  Temp: 36.8 C 36.3 C    Last Pain:  Vitals:   08/28/16 1309  TempSrc:   PainSc: Asleep         Complications: No apparent anesthesia complications

## 2016-08-28 NOTE — Discharge Instructions (Signed)

## 2016-08-28 NOTE — Anesthesia Procedure Notes (Signed)
Procedure Name: Intubation Date/Time: 08/28/2016 12:12 PM Performed by: Hedda Slade Pre-anesthesia Checklist: Patient identified, Patient being monitored, Timeout performed, Emergency Drugs available and Suction available Patient Re-evaluated:Patient Re-evaluated prior to inductionOxygen Delivery Method: Circle system utilized Preoxygenation: Pre-oxygenation with 100% oxygen Intubation Type: IV induction Ventilation: Mask ventilation without difficulty Laryngoscope Size: Mac and 3 Grade View: Grade I Tube type: Oral Tube size: 7.0 mm Number of attempts: 1 Airway Equipment and Method: Stylet Placement Confirmation: ETT inserted through vocal cords under direct vision,  positive ETCO2 and breath sounds checked- equal and bilateral Secured at: 21 cm Tube secured with: Tape Dental Injury: Teeth and Oropharynx as per pre-operative assessment

## 2016-08-28 NOTE — Anesthesia Post-op Follow-up Note (Cosign Needed)
Anesthesia QCDR form completed.        

## 2016-08-28 NOTE — Anesthesia Preprocedure Evaluation (Signed)
Anesthesia Evaluation  Patient identified by MRN, date of birth, ID band Patient awake    Reviewed: Allergy & Precautions, H&P , NPO status , Patient's Chart, lab work & pertinent test results, reviewed documented beta blocker date and time   History of Anesthesia Complications Negative for: history of anesthetic complications  Airway Mallampati: II  TM Distance: >3 FB Neck ROM: full    Dental  (+) Edentulous Upper, Upper Dentures, Dental Advidsory Given   Pulmonary neg shortness of breath, neg sleep apnea, neg COPD, neg recent URI, Current Smoker,           Cardiovascular Exercise Tolerance: Good negative cardio ROS       Neuro/Psych PSYCHIATRIC DISORDERS (Depression) negative neurological ROS     GI/Hepatic Neg liver ROS, GERD  ,  Endo/Other  negative endocrine ROS  Renal/GU negative Renal ROS  negative genitourinary   Musculoskeletal   Abdominal   Peds  Hematology negative hematology ROS (+)   Anesthesia Other Findings Past Medical History: No date: Depression     Comment: X 1-WAS AN ISOLATED EVENT PER PT 08/2016: Gallstones No date: GERD (gastroesophageal reflux disease) No date: Psoriasis   Reproductive/Obstetrics negative OB ROS                             Anesthesia Physical Anesthesia Plan  ASA: II  Anesthesia Plan: General   Post-op Pain Management:    Induction: Intravenous  PONV Risk Score and Plan: 2 and Ondansetron and Dexamethasone  Airway Management Planned: Oral ETT  Additional Equipment:   Intra-op Plan:   Post-operative Plan: Extubation in OR  Informed Consent: I have reviewed the patients History and Physical, chart, labs and discussed the procedure including the risks, benefits and alternatives for the proposed anesthesia with the patient or authorized representative who has indicated his/her understanding and acceptance.   Dental Advisory  Given  Plan Discussed with: Anesthesiologist, CRNA and Surgeon  Anesthesia Plan Comments:         Anesthesia Quick Evaluation

## 2016-08-28 NOTE — Interval H&P Note (Signed)
History and Physical Interval Note:  08/28/2016 11:35 AM  Leslie Moyer  has presented today for surgery, with the diagnosis of CHOLELITHIASIS  The various methods of treatment have been discussed with the patient and family. After consideration of risks, benefits and other options for treatment, the patient has consented to  Procedure(s): LAPAROSCOPIC CHOLECYSTECTOMY (N/A) as a surgical intervention .  The patient's history has been reviewed, patient examined, no change in status, stable for surgery.  I have reviewed the patient's chart and labs.  Questions were answered to the patient's satisfaction.     [name]

## 2016-08-28 NOTE — Anesthesia Postprocedure Evaluation (Signed)
Anesthesia Post Note  Patient: Leslie Moyer  Procedure(s) Performed: Procedure(s) (LRB): LAPAROSCOPIC CHOLECYSTECTOMY (N/A)  Patient location during evaluation: PACU Anesthesia Type: General Level of consciousness: awake and alert Pain management: pain level controlled Vital Signs Assessment: post-procedure vital signs reviewed and stable Respiratory status: spontaneous breathing, nonlabored ventilation, respiratory function stable and patient connected to nasal cannula oxygen Cardiovascular status: blood pressure returned to baseline and stable Postop Assessment: no signs of nausea or vomiting Anesthetic complications: no     Last Vitals:  Vitals:   08/28/16 1339 08/28/16 1349  BP: 119/65 112/67  Pulse: 80 81  Resp: 17 19  Temp:  36.6 C    Last Pain:  Vitals:   08/28/16 1349  TempSrc:   PainSc: 2                  Lenard SimmerAndrew Trimaine Maser

## 2016-08-29 ENCOUNTER — Encounter: Payer: Self-pay | Admitting: Surgery

## 2016-08-30 LAB — SURGICAL PATHOLOGY

## 2016-09-12 ENCOUNTER — Ambulatory Visit (INDEPENDENT_AMBULATORY_CARE_PROVIDER_SITE_OTHER): Payer: Managed Care, Other (non HMO) | Admitting: Surgery

## 2016-09-12 ENCOUNTER — Encounter: Payer: Self-pay | Admitting: Surgery

## 2016-09-12 VITALS — Ht 66.0 in

## 2016-09-12 DIAGNOSIS — Z09 Encounter for follow-up examination after completed treatment for conditions other than malignant neoplasm: Secondary | ICD-10-CM

## 2016-09-12 MED ORDER — NYSTATIN 100000 UNIT/GM EX POWD: Freq: Four times a day (QID) | CUTANEOUS | 0 refills | Status: AC | PRN

## 2016-09-12 NOTE — Patient Instructions (Signed)
Please call our office with any questions or concerns.  Please do not submerge in a tub, hot tub, or pool until incisions are completely sealed.  Use sun block to incision area over the next year if this area will be exposed to sun. This helps decrease scarring.  You may resume your normal activities on 09/25/16. At that time- Listen to your body when lifting, if you have pain when lifting, stop and then try again in a few days. Pain after doing exercises or activities of daily living is normal as you get back in to your normal routine. Please see your work note provided.  If you develop redness, drainage, or pain at incision sites- call our office immediately and speak with a nurse.

## 2016-09-12 NOTE — Progress Notes (Signed)
09/12/2016  HPI: Patient is s/p laparoscopic cholecystectomy with Dr. Everlene FarrierPabon on 6/20 for symptomatic cholelithiasis.  She presents today for post-op check.  She reports having some fullness feeling and mild diarrhea.  Denies any abdominal pain, nausea, or vomiting.  Has some redness over the umbilical incision but no drainage.  Vital signs: Ht 5\' 6"  (1.676 m)   LMP 08/25/2016 (Exact Date)    Physical Exam: Constitutional:  No acute distress Abdomen:  Soft, nondistended, nontender to palpation.  Incisions are clean, dry, and intact and healing well.  There is very mild erythema over the inferior portion of the umbilicus, likely mild yeast infection.  No drainage from any of the incisions.  Assessment/Plan: 31 yo female s/p lap cholecystectomy.  --Pathology reviewed with patient.  Negative for malignancy. --Patient still has a no heavy lifting or pushing restriction of more than 10-15 lbs until 7/18.  She may resume regular activities afterwards. --Will prescribe nystatin powder to apply over the umbilical region for her yeast infection.  Reassured patient that the feeling of fullness and diarrhea will continue to improve as her body adjusts post-op. --Patient may return to work next week.  Work note will be given. --Patient may follow up on an as needed basis   Howie IllJose Luis Shatia Sindoni, MD [facility]

## 2017-10-28 ENCOUNTER — Other Ambulatory Visit: Payer: Self-pay | Admitting: Nurse Practitioner

## 2017-10-28 DIAGNOSIS — N63 Unspecified lump in unspecified breast: Secondary | ICD-10-CM

## 2017-10-28 DIAGNOSIS — N83201 Unspecified ovarian cyst, right side: Secondary | ICD-10-CM

## 2017-11-03 ENCOUNTER — Other Ambulatory Visit: Payer: Managed Care, Other (non HMO)

## 2017-11-04 ENCOUNTER — Ambulatory Visit
Admission: RE | Admit: 2017-11-04 | Discharge: 2017-11-04 | Disposition: A | Discharge status: Home / Self Care | Payer: 59 | Source: Ambulatory Visit | Attending: Nurse Practitioner | Admitting: Nurse Practitioner

## 2017-11-04 DIAGNOSIS — N83202 Unspecified ovarian cyst, left side: Secondary | ICD-10-CM | POA: Insufficient documentation

## 2017-11-04 DIAGNOSIS — N83201 Unspecified ovarian cyst, right side: Secondary | ICD-10-CM | POA: Insufficient documentation

## 2017-11-11 ENCOUNTER — Ambulatory Visit
Admission: RE | Admit: 2017-11-11 | Discharge: 2017-11-11 | Disposition: A | Discharge status: Home / Self Care | Payer: 59 | Source: Ambulatory Visit | Attending: Nurse Practitioner | Admitting: Nurse Practitioner

## 2017-11-11 DIAGNOSIS — N63 Unspecified lump in unspecified breast: Secondary | ICD-10-CM

## 2017-11-26 ENCOUNTER — Other Ambulatory Visit: Payer: Self-pay | Admitting: Nurse Practitioner

## 2017-11-26 DIAGNOSIS — N83202 Unspecified ovarian cyst, left side: Secondary | ICD-10-CM

## 2017-12-16 ENCOUNTER — Other Ambulatory Visit: Payer: Self-pay

## 2017-12-16 ENCOUNTER — Encounter: Payer: Self-pay | Admitting: *Deleted

## 2017-12-16 ENCOUNTER — Emergency Department
Admission: EM | Admit: 2017-12-16 | Discharge: 2017-12-16 | Disposition: A | Discharge status: Home / Self Care | Payer: 59 | Attending: Emergency Medicine | Admitting: Emergency Medicine

## 2017-12-16 ENCOUNTER — Emergency Department: Payer: 59

## 2017-12-16 DIAGNOSIS — M545 Low back pain: Secondary | ICD-10-CM | POA: Diagnosis not present

## 2017-12-16 DIAGNOSIS — Y929 Unspecified place or not applicable: Secondary | ICD-10-CM | POA: Insufficient documentation

## 2017-12-16 DIAGNOSIS — Y999 Unspecified external cause status: Secondary | ICD-10-CM | POA: Diagnosis not present

## 2017-12-16 DIAGNOSIS — M549 Dorsalgia, unspecified: Secondary | ICD-10-CM

## 2017-12-16 DIAGNOSIS — Y939 Activity, unspecified: Secondary | ICD-10-CM | POA: Insufficient documentation

## 2017-12-16 DIAGNOSIS — X509XXA Other and unspecified overexertion or strenuous movements or postures, initial encounter: Secondary | ICD-10-CM | POA: Insufficient documentation

## 2017-12-16 DIAGNOSIS — F17210 Nicotine dependence, cigarettes, uncomplicated: Secondary | ICD-10-CM | POA: Insufficient documentation

## 2017-12-16 DIAGNOSIS — S3992XA Unspecified injury of lower back, initial encounter: Secondary | ICD-10-CM | POA: Diagnosis present

## 2017-12-16 LAB — CBC WITH DIFFERENTIAL/PLATELET
BASOS PCT: 1 %
Basophils Absolute: 0.1 10*3/uL (ref 0–0.1)
Eosinophils Absolute: 0.2 10*3/uL (ref 0–0.7)
Eosinophils Relative: 3 %
HEMATOCRIT: 42.6 % (ref 35.0–47.0)
HEMOGLOBIN: 14.1 g/dL (ref 12.0–16.0)
LYMPHS ABS: 1.8 10*3/uL (ref 1.0–3.6)
LYMPHS PCT: 19 %
MCH: 30.2 pg (ref 26.0–34.0)
MCHC: 33.2 g/dL (ref 32.0–36.0)
MCV: 90.9 fL (ref 80.0–100.0)
Monocytes Absolute: 0.9 10*3/uL (ref 0.2–0.9)
Monocytes Relative: 9 %
NEUTROS ABS: 6.6 10*3/uL — AB (ref 1.4–6.5)
Neutrophils Relative %: 68 %
Platelets: 338 10*3/uL (ref 150–440)
RBC: 4.69 MIL/uL (ref 3.80–5.20)
RDW: 13.6 % (ref 11.5–14.5)
WBC: 9.6 10*3/uL (ref 3.6–11.0)

## 2017-12-16 LAB — COMPREHENSIVE METABOLIC PANEL
ALBUMIN: 4.3 g/dL (ref 3.5–5.0)
ALK PHOS: 50 U/L (ref 38–126)
ALT: 47 U/L — ABNORMAL HIGH (ref 0–44)
ANION GAP: 5 (ref 5–15)
AST: 33 U/L (ref 15–41)
BILIRUBIN TOTAL: 0.4 mg/dL (ref 0.3–1.2)
BUN: 12 mg/dL (ref 6–20)
CALCIUM: 9 mg/dL (ref 8.9–10.3)
CO2: 28 mmol/L (ref 22–32)
Chloride: 104 mmol/L (ref 98–111)
Creatinine, Ser: 0.69 mg/dL (ref 0.44–1.00)
GFR calc Af Amer: >60 mL/min (ref >60)
GLUCOSE: 97 mg/dL (ref 70–99)
Potassium: 3.8 mmol/L (ref 3.5–5.1)
Sodium: 137 mmol/L (ref 135–145)
TOTAL PROTEIN: 7 g/dL (ref 6.5–8.1)

## 2017-12-16 LAB — URINALYSIS, COMPLETE (UACMP) WITH MICROSCOPIC
BILIRUBIN URINE: NEGATIVE
Glucose, UA: NEGATIVE mg/dL
KETONES UR: NEGATIVE mg/dL
Leukocytes, UA: NEGATIVE
Nitrite: NEGATIVE
PH: 6 (ref 5.0–8.0)
PROTEIN: NEGATIVE mg/dL
Specific Gravity, Urine: 1.004 — ABNORMAL LOW (ref 1.005–1.030)
WBC UA: NONE SEEN WBC/hpf (ref 0–5)

## 2017-12-16 LAB — LIPASE, BLOOD: Lipase: 32 U/L (ref 11–51)

## 2017-12-16 LAB — PREGNANCY, URINE: Preg Test, Ur: NEGATIVE

## 2017-12-16 NOTE — ED Notes (Signed)
Patient discharged to home per MD order. Patient in stable condition, and deemed medically cleared by ED provider for discharge. Discharge instructions reviewed with patient/family using "Teach Back"; verbalized understanding of medication education and administration, and information about follow-up care. Denies further concerns. ° °

## 2017-12-16 NOTE — ED Notes (Signed)
Pt with nosebleed to left nare, md aware. Nose clamp in place.

## 2017-12-16 NOTE — ED Provider Notes (Signed)
[facility] Emergency Department Provider Note  ___________________________________________   First MD Initiated Contact with Patient 12/16/17 0240     (approximate)  I have reviewed the triage vital signs and the nursing notes.   HISTORY  Chief Complaint Back Pain   HPI Leslie Moyer is a 32 y.o. female with a history of a cholecystectomy as well as psoriasis was presented to the emergency department with bilateral lower thoracic and upper lumbar back pain over the past 3 days.  Says the pain is waking her up in the middle the night and is a cramping pain.  Says that she is currently on her period.  Denies any blood in her urine.  No history of kidney stones.  Says that she has chronic nausea and diarrhea after having her gallbladder removed which is unchanged at this time.  Denies any abdominal pain.  No burning with urination nor is or any increased frequency of urination.  Patient does not report any radiation to her bilateral lower extremities.  Does not report any urinary retention or loss of bowel or bladder continence.  Does not report any numbness or weakness.  Denies injury.  Says that when she is distracted she does not notice the pain.  All says the pain is been controlled with Tylenol.  Says the pain does not worsen with movement in order to get better with sitting still.  Past Medical History:  Diagnosis Date  . Depression    X 1-WAS AN ISOLATED EVENT PER PT  . Gallstones 08/2016  . GERD (gastroesophageal reflux disease)   . Psoriasis     Patient Active Problem List   Diagnosis Date Noted  . Calculus of gallbladder with chronic cholecystitis without obstruction   . RUQ pain     Past Surgical History:  Procedure Laterality Date  . CHOLECYSTECTOMY N/A 08/28/2016   Procedure: LAPAROSCOPIC CHOLECYSTECTOMY;  Surgeon: Leafy Ro, MD;  Location: [facility] ORS;  Service: General;  Laterality: N/A;  . MOUTH SURGERY     ALL UPPER TEETH  REMOVED-WAS NOT PUT TO SLEEP FOR THIS    Prior to Admission medications   Medication Sig Start Date End Date Taking? Authorizing Provider  acetaminophen (TYLENOL) 500 MG tablet Take 1,000 mg by mouth 2 (two) times daily as needed for headache.    [provider]  calcium carbonate (TUMS - DOSED IN MG ELEMENTAL CALCIUM) 500 MG chewable tablet Chew 2 tablets by mouth daily as needed for indigestion or heartburn.    [provider]  HYDROcodone-acetaminophen (NORCO/VICODIN) 5-325 MG tablet Take 1-2 tablets by mouth every 4 (four) hours as needed for moderate pain. 08/28/16   Pabon, Diego F, MD  ibuprofen (ADVIL,MOTRIN) 200 MG tablet Take 800 mg by mouth daily as needed for moderate pain.    [provider]  nystatin (MYCOSTATIN/NYSTOP) powder Apply topically 4 (four) times daily as needed. 09/12/16   Piscoya, Elita Quick, MD  Pseudoephedrine-Ibuprofen 30-200 MG TABS Take 1 tablet by mouth daily as needed (allergies).    [provider]    Allergies Onion  Family History  Problem Relation Age of Onset  . Breast cancer Maternal Grandmother   . Crohn's disease Mother     Social History Social History   Tobacco Use  . Smoking status: Current Every Day Smoker    Packs/day: 0.50    Years: 13.00    Pack years: 6.50    Types: Cigarettes  . Smokeless tobacco: Never Used  Substance Use Topics  .  Alcohol use: Yes    Comment: rarely  . Drug use: No    Review of Systems  Constitutional: No fever/chills Eyes: No visual changes. ENT: No sore throat. Cardiovascular: Denies chest pain. Respiratory: Denies shortness of breath. Gastrointestinal: No abdominal pain.  No nausea, no vomiting.  No diarrhea.  No constipation. Genitourinary: Negative for dysuria. Musculoskeletal: As above Skin: Negative for rash. Neurological: Negative for headaches, focal weakness or numbness.   ____________________________________________   PHYSICAL EXAM:  VITAL SIGNS: ED  Triage Vitals [12/16/17 0233]  Enc Vitals Group     BP (!) 143/85     Pulse Rate 75     Resp 20     Temp 98.3 F (36.8 C)     Temp Source Oral     SpO2 100 %     Weight 207 lb (93.9 kg)     Height 5\' 7"  (1.702 m)     Head Circumference      Peak Flow      Pain Score 9     Pain Loc      Pain Edu?      Excl. in GC?     Constitutional: Alert and oriented. Well appearing and in no acute distress. Eyes: Conjunctivae are normal.  Head: Atraumatic. Nose: No congestion/rhinnorhea. Mouth/Throat: Mucous membranes are moist.  Neck: No stridor.   Cardiovascular: Normal rate, regular rhythm. Grossly normal heart sounds.   Respiratory: Normal respiratory effort.  No retractions. Lungs CTAB. Gastrointestinal: Soft and nontender. No distention.  Mild bilateral tenderness to palpation.  No midline deformity or step-off. Musculoskeletal: No lower extremity tenderness nor edema.  No joint effusions. Neurologic:  Normal speech and language. No gross focal neurologic deficits are appreciated. Skin:  Skin is warm, dry and intact. No rash noted. Psychiatric: Mood and affect are normal. Speech and behavior are normal.  ____________________________________________   LABS (all labs ordered are listed, but only abnormal results are displayed)  Labs Reviewed  URINALYSIS, COMPLETE (UACMP) WITH MICROSCOPIC - Abnormal; Notable for the following components:      Result Value   Color, Urine STRAW (*)    APPearance CLEAR (*)    Specific Gravity, Urine 1.004 (*)    Hgb urine dipstick MODERATE (*)    Bacteria, UA RARE (*)    All other components within normal limits  CBC WITH DIFFERENTIAL/PLATELET - Abnormal; Notable for the following components:   Neutro Abs 6.6 (*)    All other components within normal limits  COMPREHENSIVE METABOLIC PANEL - Abnormal; Notable for the following components:   ALT 47 (*)    All other components within normal limits  PREGNANCY, URINE  LIPASE, BLOOD    ____________________________________________  EKG   ____________________________________________  RADIOLOGY  CT of the abdomen pelvis with no renal or ureteral stone.  Mild bladder wall thickening.  Interval enlargement of a 3.6 cm left adrenal gland secondary nodule.  Suggest follow-up in 1 year. ____________________________________________   PROCEDURES  Procedure(s) performed:   Procedures  Critical Care performed:   ____________________________________________   INITIAL IMPRESSION / ASSESSMENT AND PLAN / ED COURSE  Pertinent labs & imaging results that were available during my care of the patient were reviewed by me and considered in my medical decision making (see chart for details).  DDX: Musculoskeletal back pain, thoracic strain, lumbar strain, kidney stone, kidney infection, herniated disc As part of my medical decision making, I reviewed the following data within the electronic MEDICAL RECORD NUMBER Notes from prior ED visits  -----------------------------------------  4:49 AM on 12/16/2017 -----------------------------------------  Patient with reassuring vital signs.  Rare bacteria with moderate hemoglobin.  Possibly contaminated by patient being on her menstrual cycle.  No burning with urination or urinary frequency.  Unclear cause of the patient's back pain.  Reassuring CAT scan as well.  Back pain does not worsen with movement.  Patient does not have overt clear urinary tract infection.  We will send the urine for culture.  Patient says pain is relieved by Tylenol.  No fever.  Patient will continue to take Tylenol and I recommended that the patient add topical cream such as BenGay or IcyHot.  Patient to follow-up with her primary care doctor.  Patient also aware of adrenal adenoma need for follow-up in 1 year.  Understanding the plan willing to comply but will be discharged home at this time. ____________________________________________   FINAL CLINICAL  IMPRESSION(S) / ED DIAGNOSES  Back pain.  NEW MEDICATIONS STARTED DURING THIS VISIT:  New Prescriptions   No medications on file     Note:  This document was prepared using Dragon voice recognition software and may include unintentional dictation errors.     Myrna Blazer, MD 12/16/17 [phone]

## 2017-12-16 NOTE — ED Notes (Signed)
ED Provider at bedside. 

## 2017-12-16 NOTE — ED Triage Notes (Signed)
Pt has back pain for 2 days.  No known injury.  Pt denies urinary sx.  Pt alert

## 2017-12-16 NOTE — ED Notes (Signed)
Pt to ct 

## 2017-12-17 LAB — URINE CULTURE

## 2017-12-30 ENCOUNTER — Ambulatory Visit: Payer: 59

## 2018-06-01 ENCOUNTER — Other Ambulatory Visit: Payer: Self-pay | Admitting: Nurse Practitioner

## 2018-06-01 DIAGNOSIS — N83201 Unspecified ovarian cyst, right side: Secondary | ICD-10-CM

## 2018-07-02 ENCOUNTER — Ambulatory Visit: Payer: BLUE CROSS/BLUE SHIELD | Attending: Nurse Practitioner

## 2018-07-04 IMAGING — CR DG CHEST 2V
2 series · 2 of 2 positions shown · non-contrast
Comparison: None.

CLINICAL DATA: Intermittent chest pain over the last 8 months with
increasing frequency. Initial encounter.

EXAM:
CHEST  2 VIEW

[chest pa]
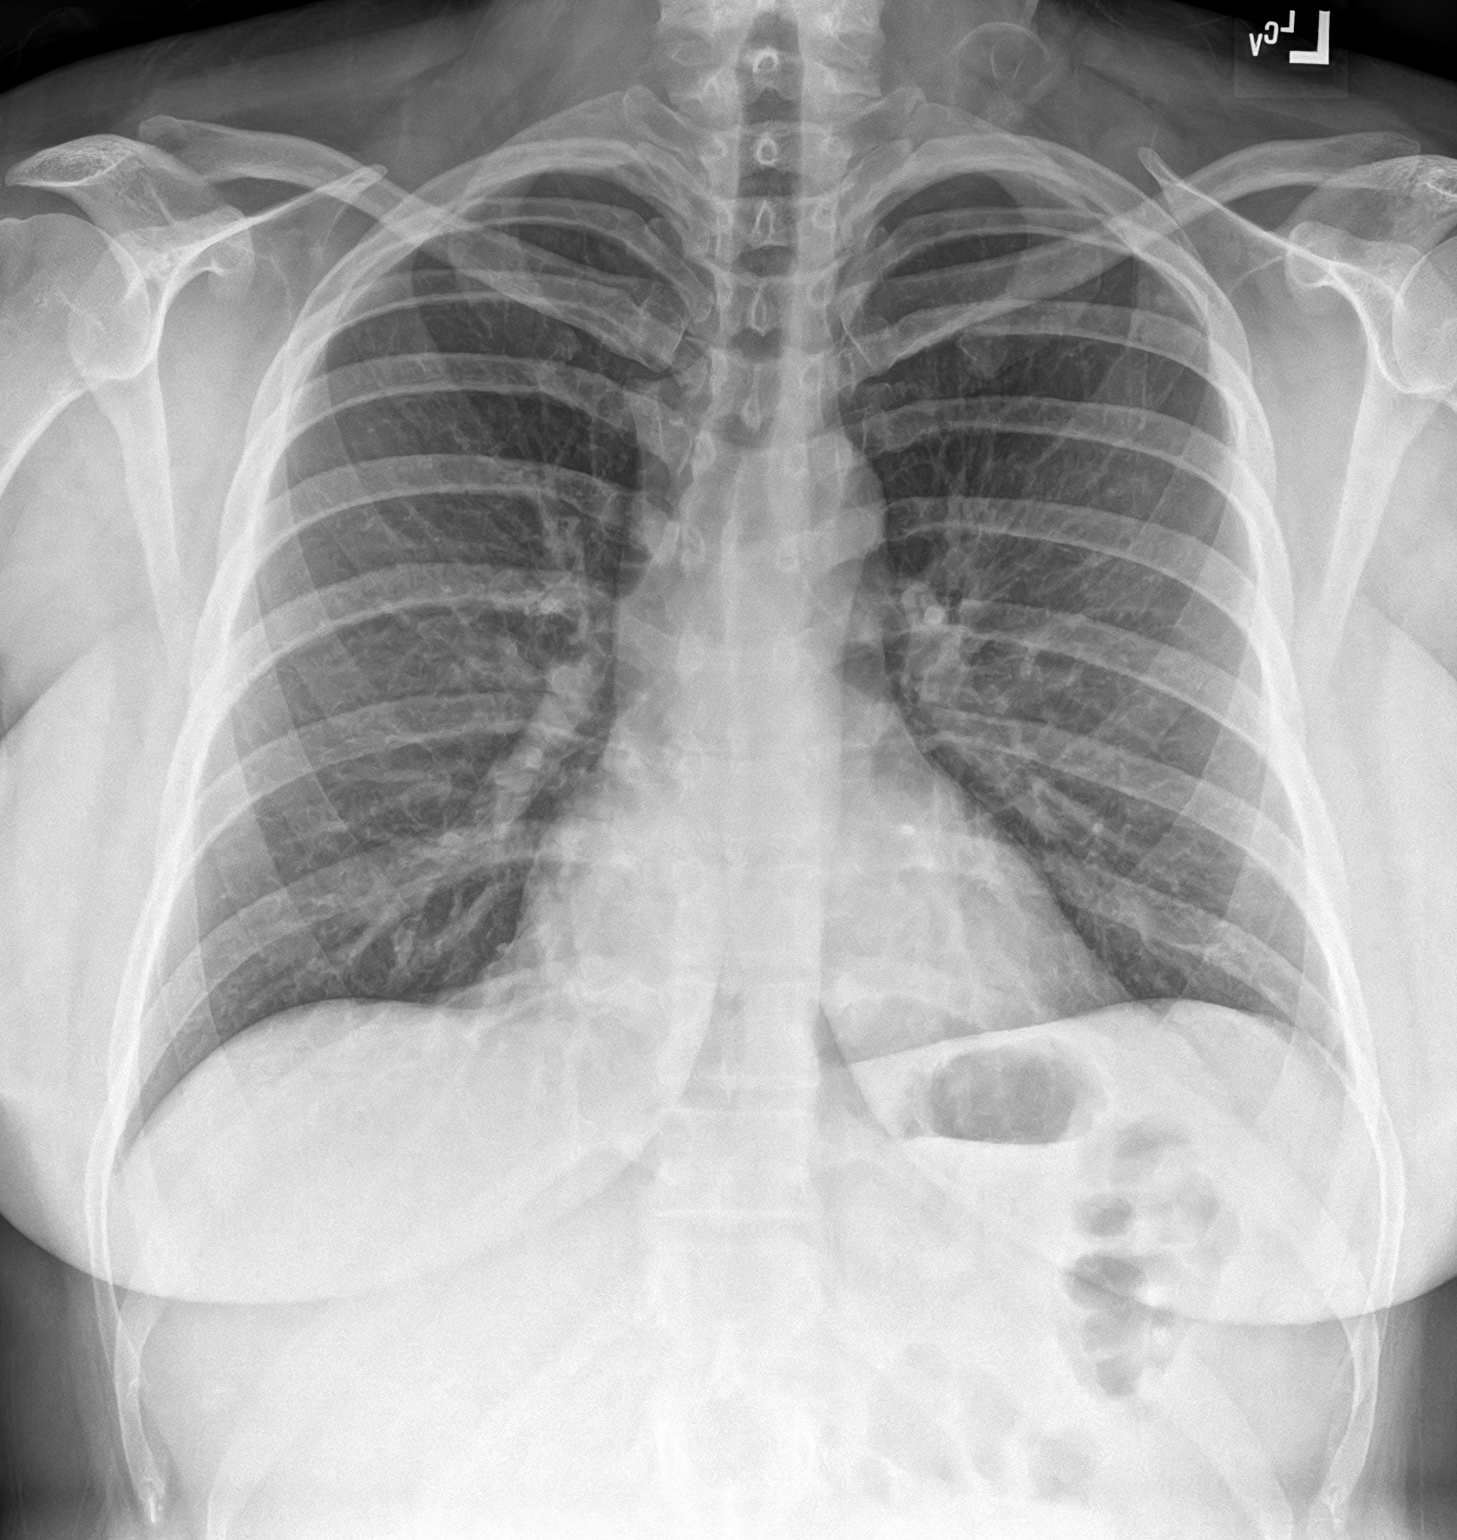

[chest lat]
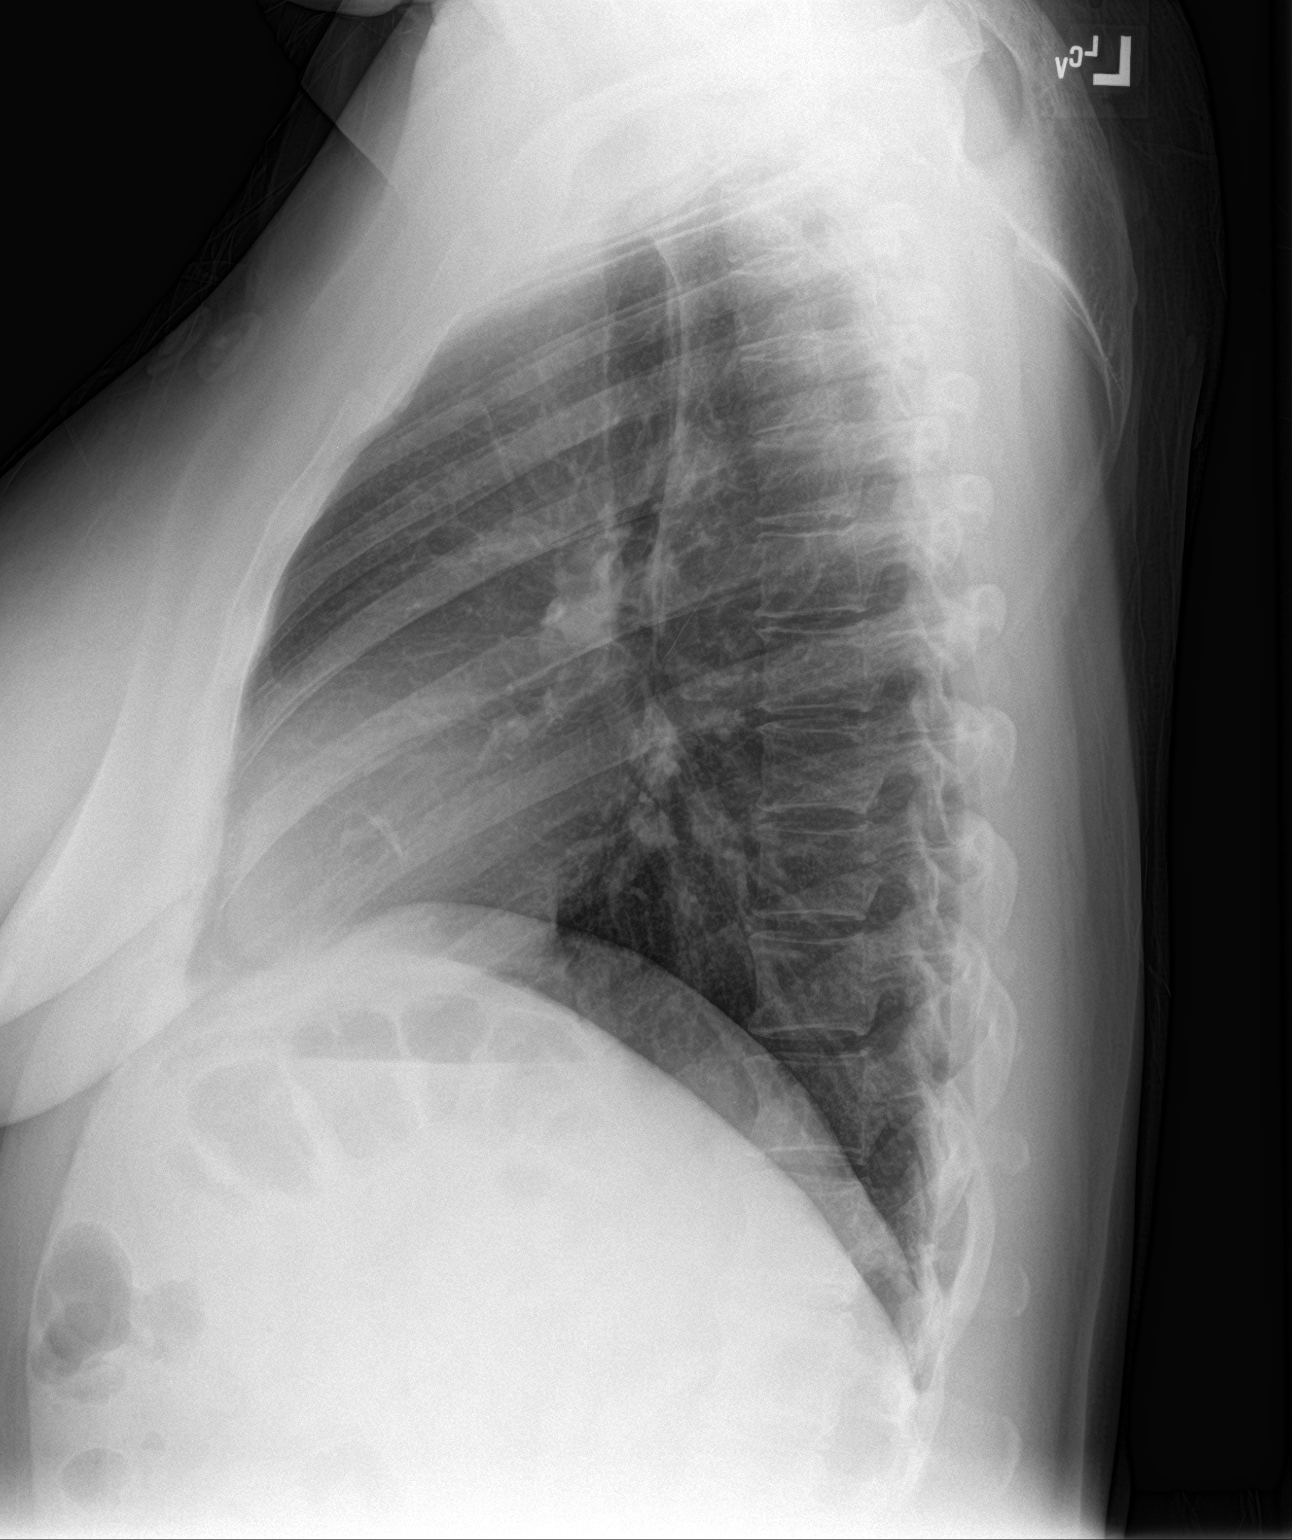

[2 of 2 positions shown; findings below may reference images not displayed]

FINDINGS: The heart size and mediastinal contours are within normal limits.
Both lungs are clear. The visualized skeletal structures are
unremarkable.
IMPRESSION: Negative two view chest x-ray

## 2018-08-13 ENCOUNTER — Other Ambulatory Visit: Payer: Self-pay

## 2018-08-13 ENCOUNTER — Ambulatory Visit
Admission: RE | Admit: 2018-08-13 | Discharge: 2018-08-13 | Disposition: A | Discharge status: Home / Self Care | Payer: BC Managed Care – PPO | Source: Ambulatory Visit | Attending: Nurse Practitioner | Admitting: Nurse Practitioner

## 2018-08-13 DIAGNOSIS — N83201 Unspecified ovarian cyst, right side: Secondary | ICD-10-CM | POA: Diagnosis not present

## 2019-03-25 IMAGING — US US ABDOMEN LIMITED
1 series · 14 of 25 positions shown · non-contrast
Comparison: None.

CLINICAL DATA: Right upper quadrant abdominal pain and chest pain
for 8 months with progression.

EXAM:
ULTRASOUND ABDOMEN LIMITED RIGHT UPPER QUADRANT

[Series 1: us abdomen limited · 0.26mm/px · 14 of 54 slices shown]
[im 1/54]
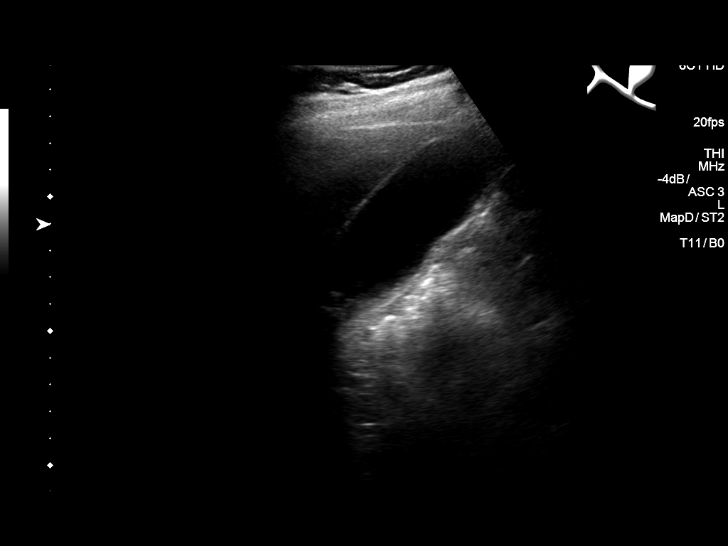
[im 5/54]
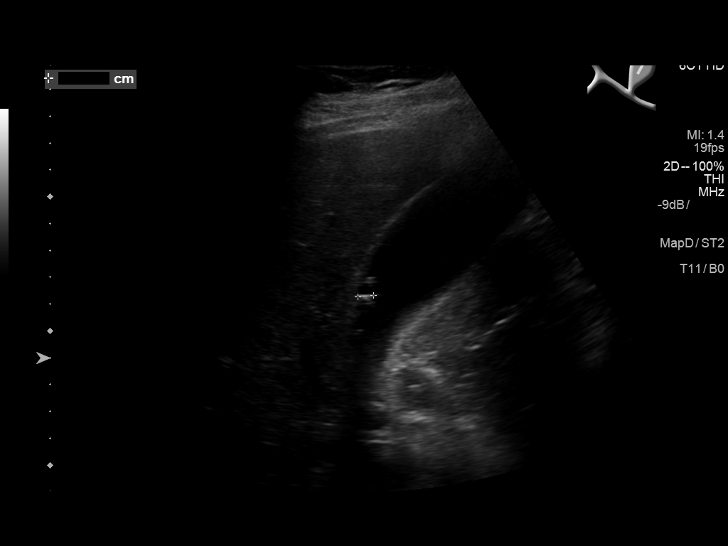
[im 9/54]
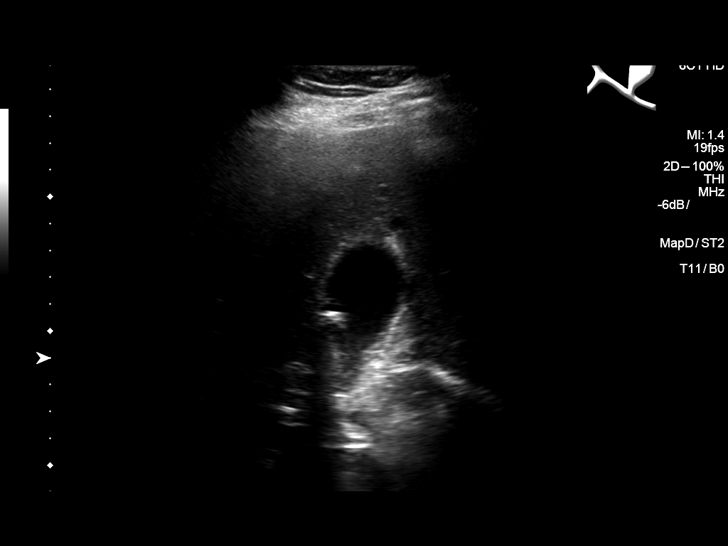
[im 14/54]
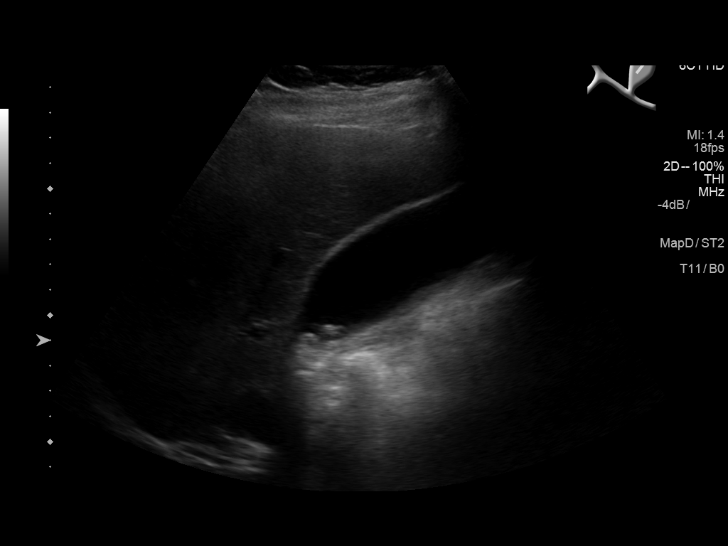
[im 18/54]
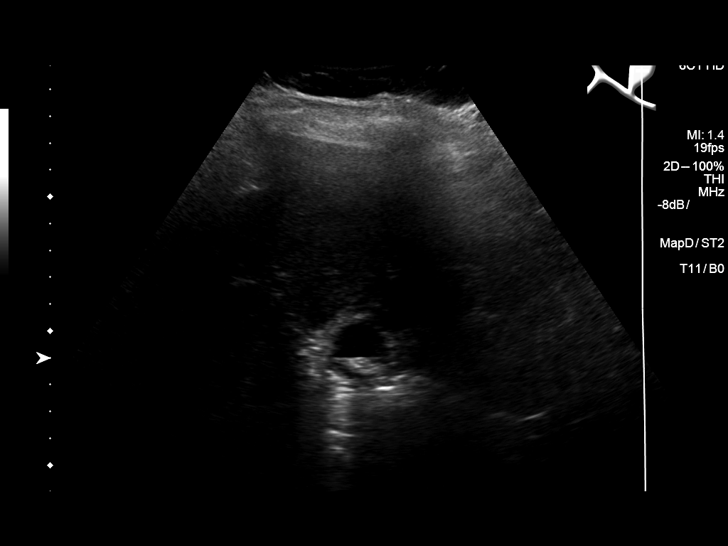
[im 20/54]
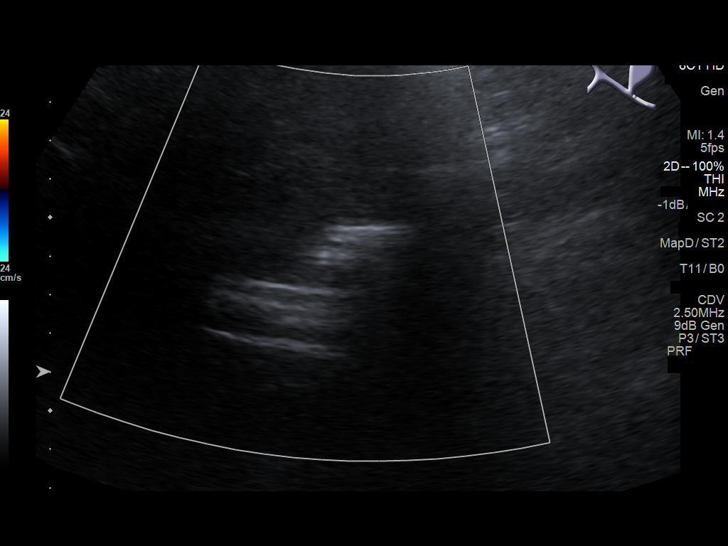
[im 25/54]
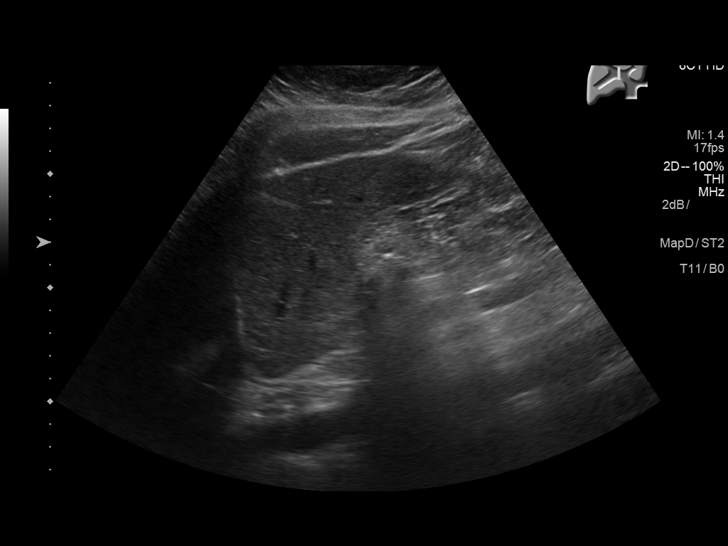
[im 29/54]
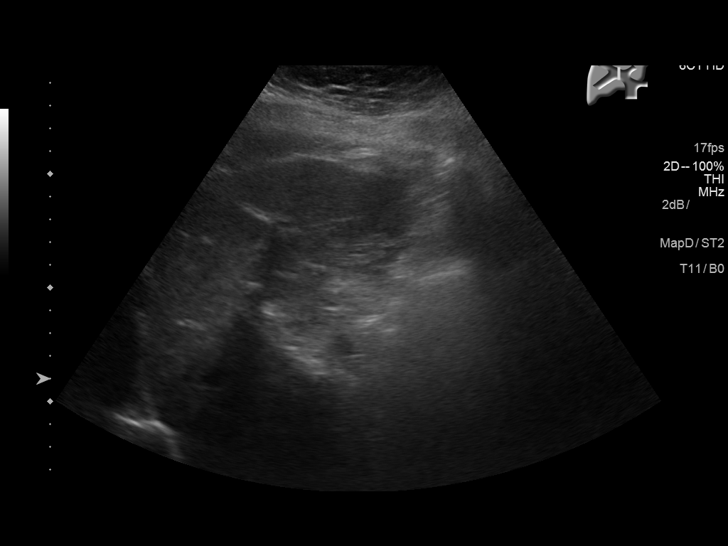
[im 34/54]
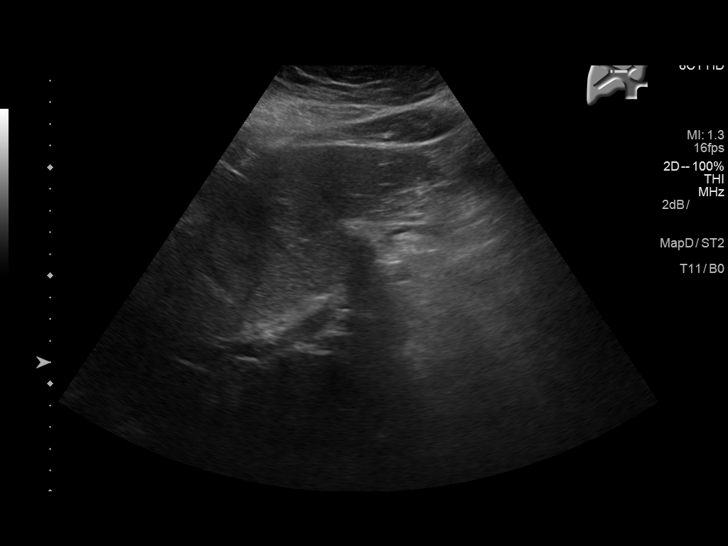
[im 36/54]
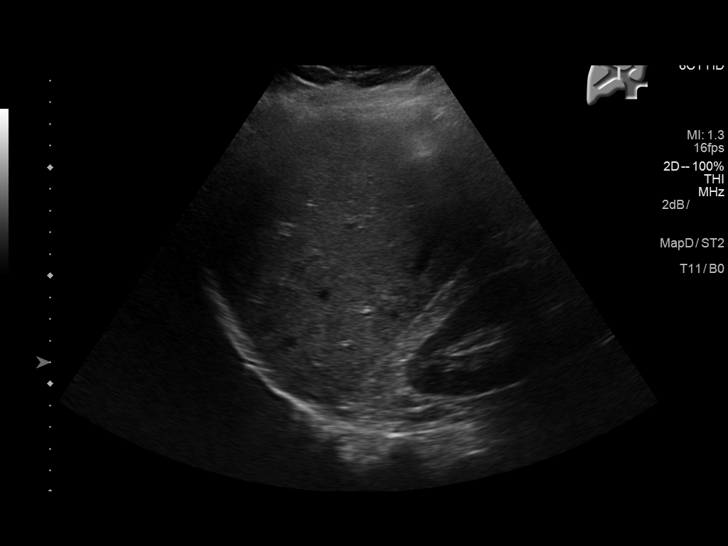
[im 40/54]
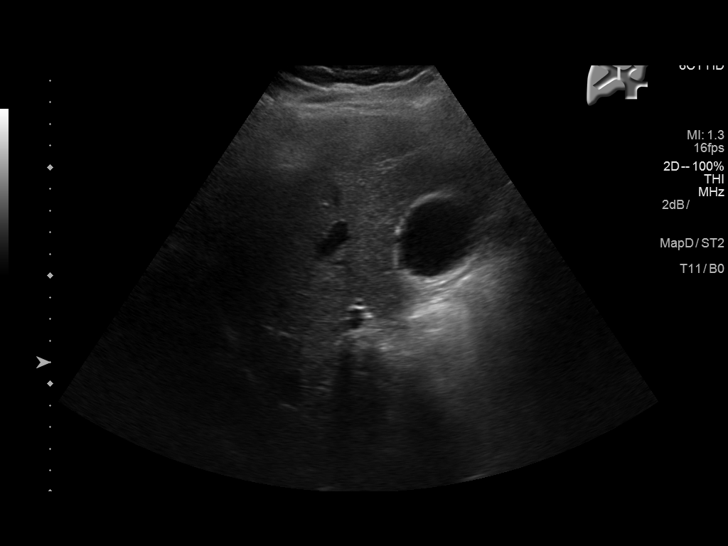
[im 45/54]
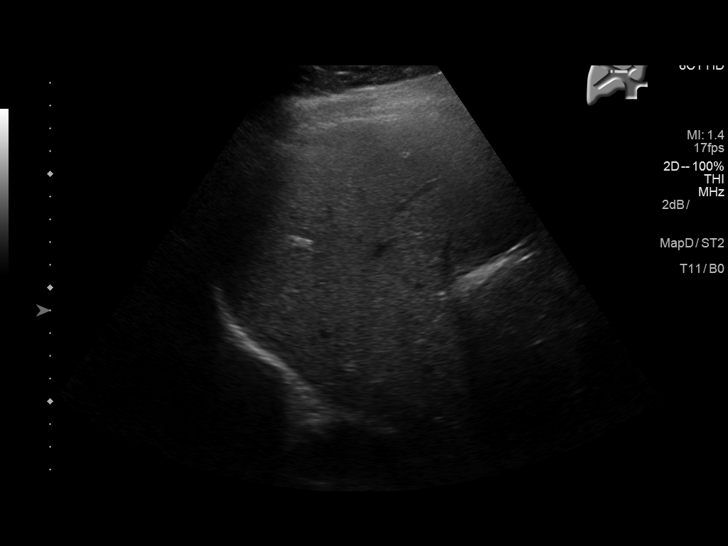
[im 49/54]
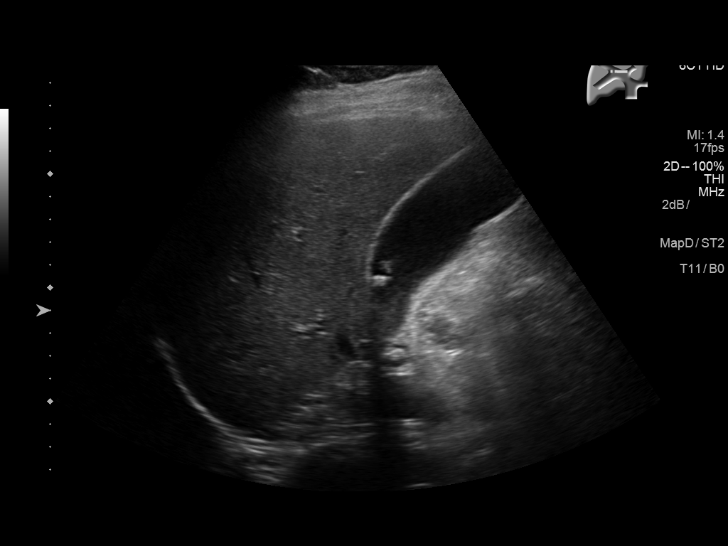
[im 54/54]
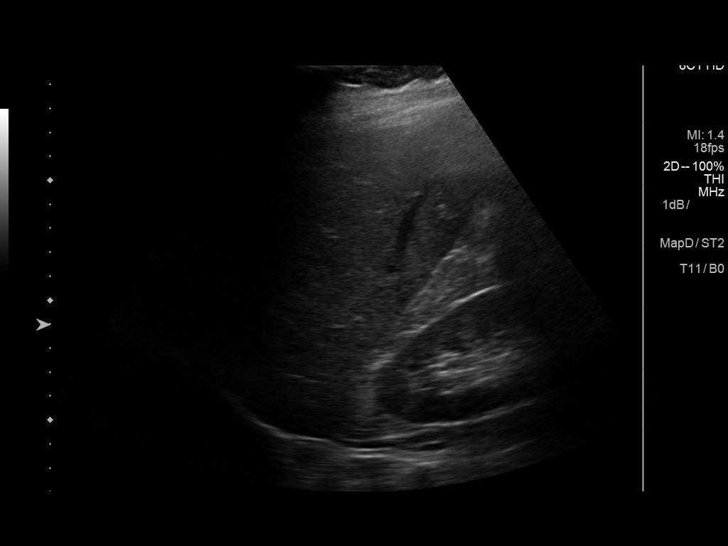

[14 of 25 positions shown; findings below may reference images not displayed]

FINDINGS: Gallbladder:

Multiple mobile shadowing gallstones are present. The largest
measures 6 mm. Wall thickness is within normal limits at 1.8 mm. No
sonographic Murphy sign is reported. The technologist notes that the
patient was medicated prior to the exam.

Common bile duct:

Diameter: 3.4 mm, within normal limits

Liver:

No focal lesion identified. Within normal limits in parenchymal
echogenicity.
IMPRESSION: Cholelithiasis without sonographic evidence for cholecystitis.

## 2019-10-26 IMAGING — CT CT RENAL STONE PROTOCOL
2 of 4 series · 15 of 46 positions shown, 17 images · non-contrast
Comparison: 08/24/2016

CLINICAL DATA: Back pain for 2 days. No injury.

EXAM:
CT ABDOMEN AND PELVIS WITHOUT CONTRAST
TECHNIQUE: Multidetector CT imaging of the abdomen and pelvis was performed
following the standard protocol without IV contrast.

[Series 2: stone full standard · axial · 0.72mm/px · z∈[-1114,-634]mm · 12 of 106 slices shown, 14 images]
[im 5/106  soft-tissue]
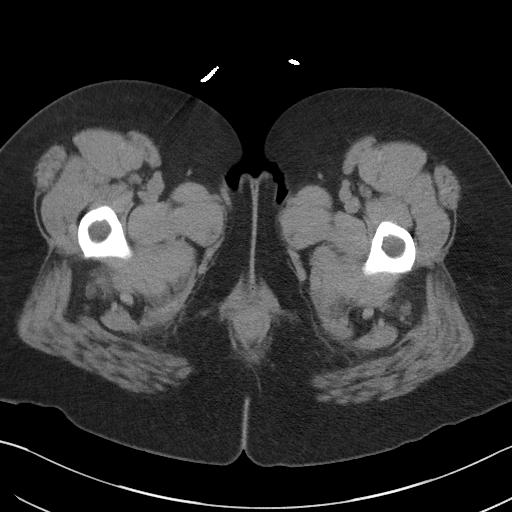
[im 5/106  bone]
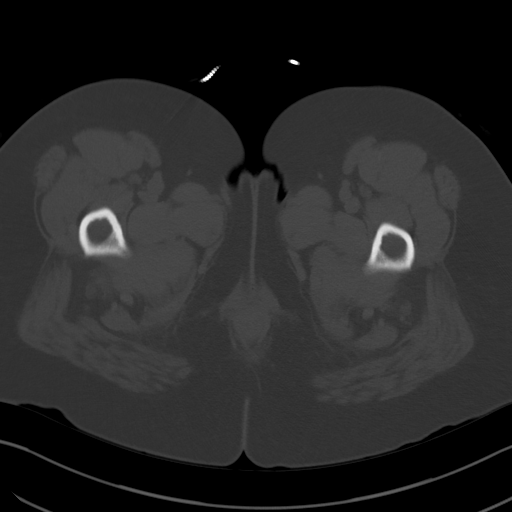
[im 14/106  soft-tissue]
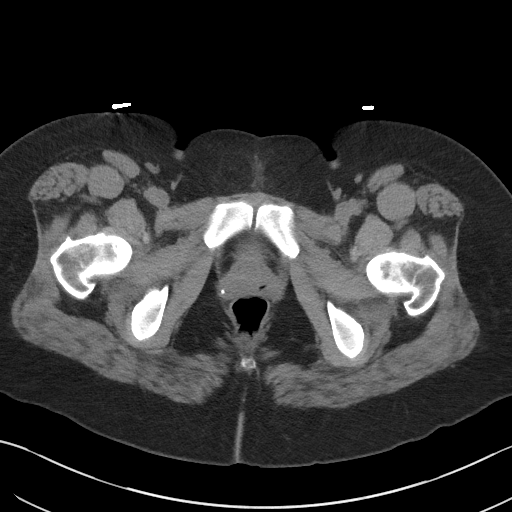
[im 23/106  soft-tissue]
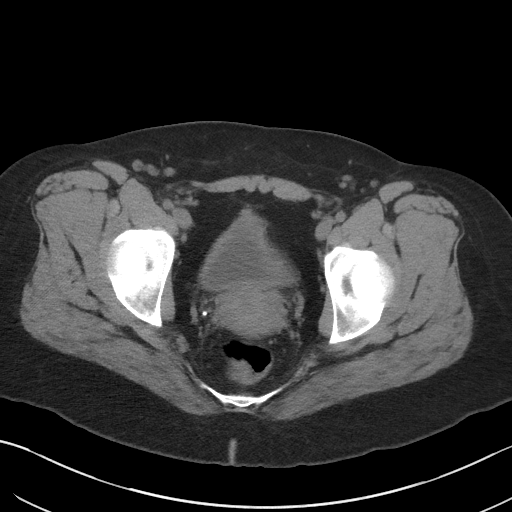
[im 32/106  soft-tissue]
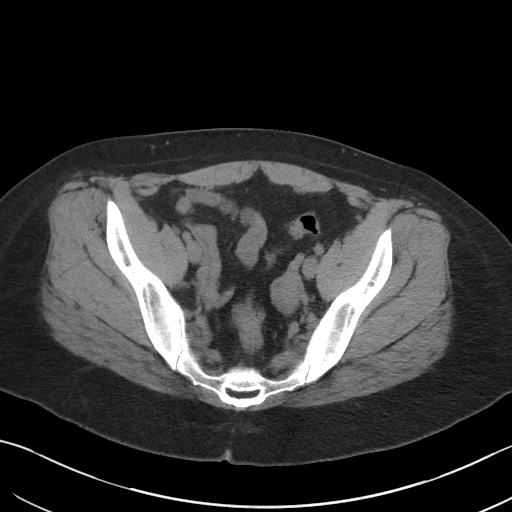
[im 42/106  soft-tissue]
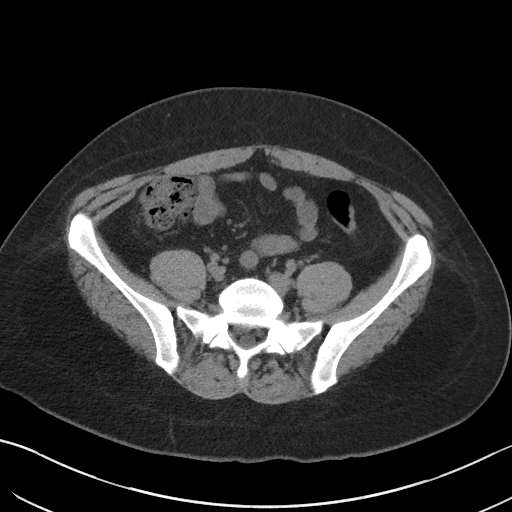
[im 51/106  soft-tissue]
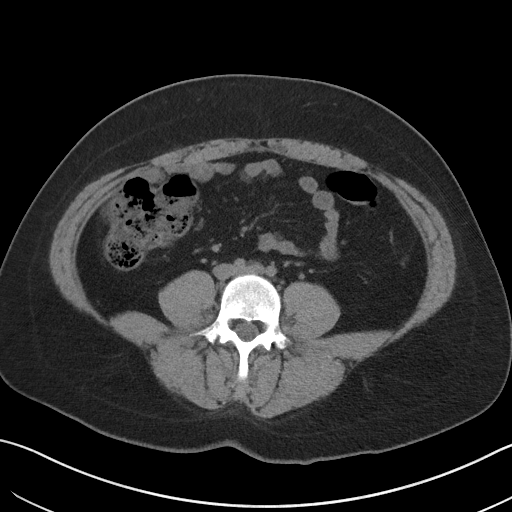
[im 55/106  soft-tissue]
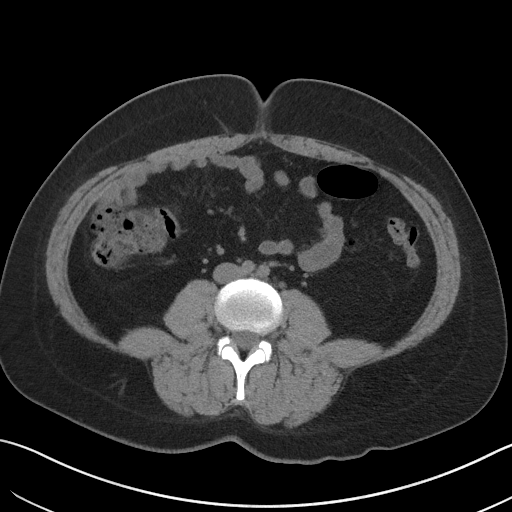
[im 64/106  soft-tissue]
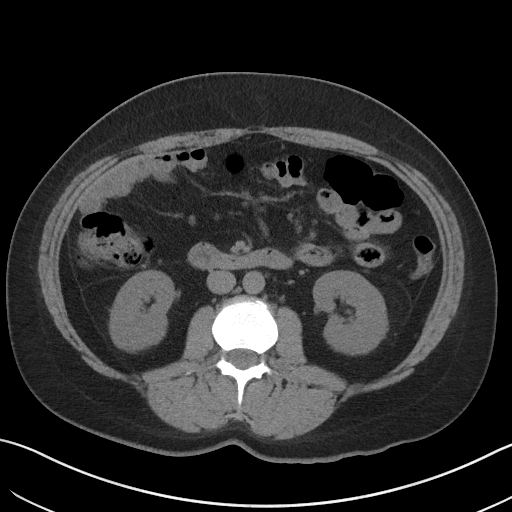
[im 74/106  soft-tissue]
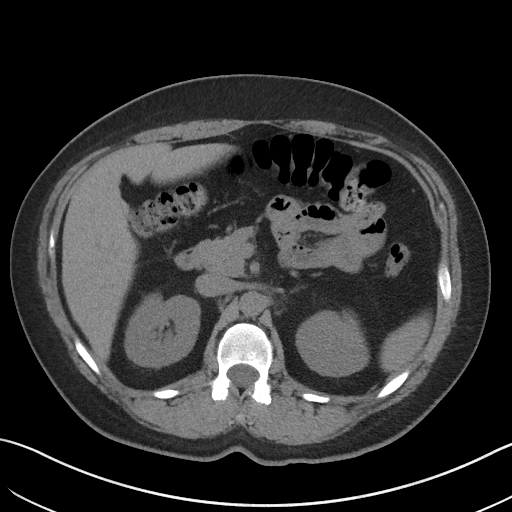
[im 74/106  bone]
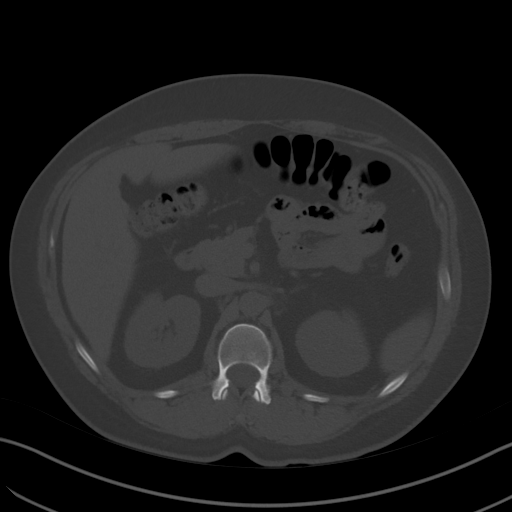
[im 83/106  soft-tissue]
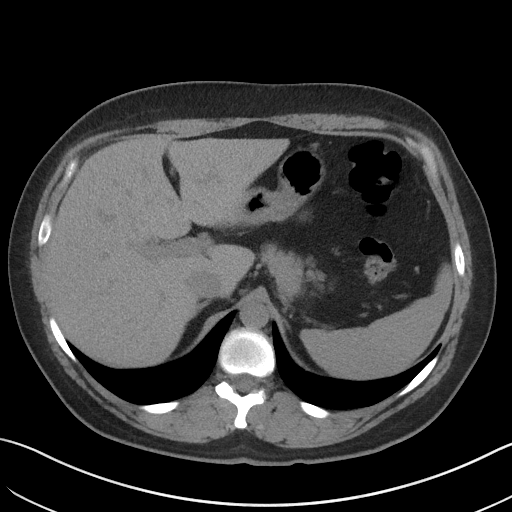
[im 92/106  soft-tissue]
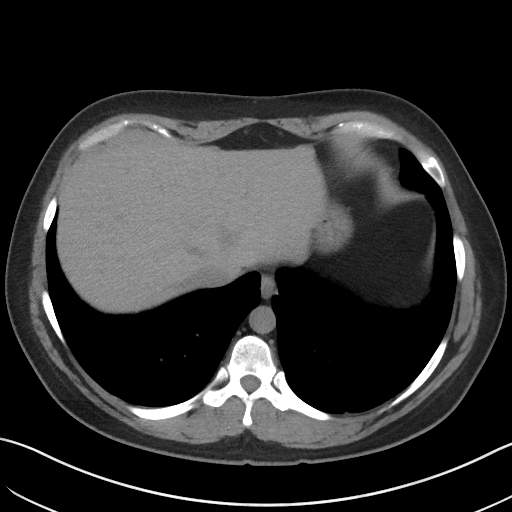
[im 101/106  soft-tissue]
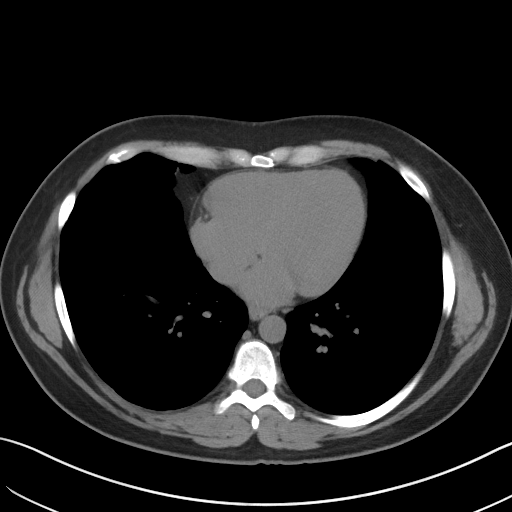

[Series 5: coronal · coronal · 0.70mm/px · 3 of 128 slices shown]
[im 43/128  soft-tissue]
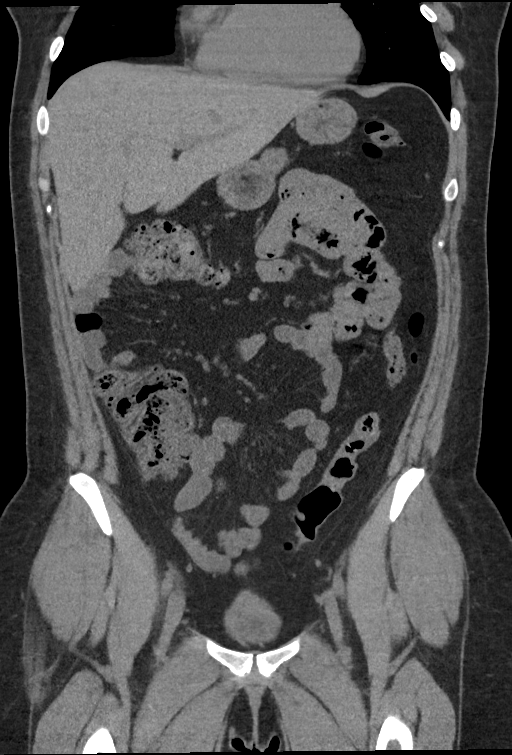
[im 57/128  soft-tissue]
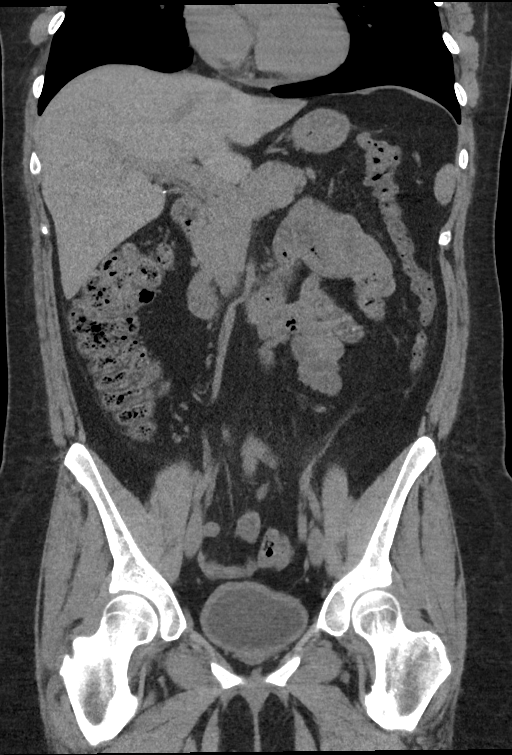
[im 71/128  soft-tissue]
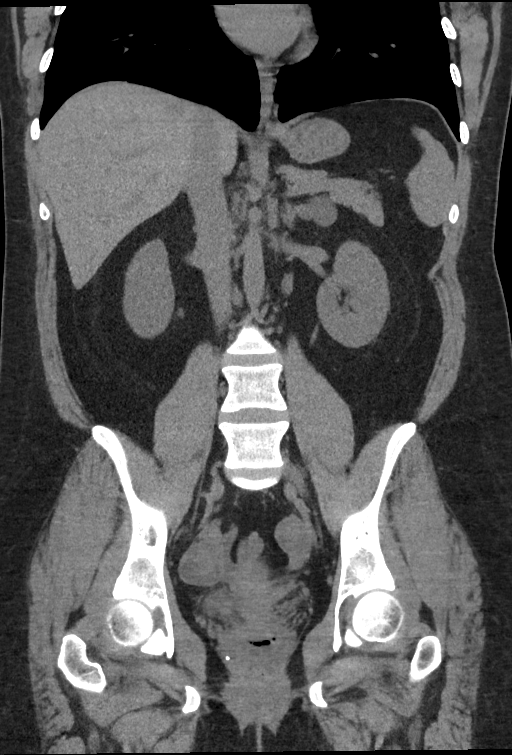

[15 of 46 positions shown; findings below may reference images not displayed]

FINDINGS: Lower chest: Lung bases are clear.

Hepatobiliary: No focal liver abnormality is seen. Status post
cholecystectomy. No biliary dilatation.

Pancreas: Unremarkable. No pancreatic ductal dilatation or
surrounding inflammatory changes.

Spleen: Normal in size without focal abnormality.

Adrenals/Urinary Tract: 3.6 cm left adrenal gland nodule increasing
in size since prior study. Density measurements are consistent with
a fat containing adenoma. Due to significant increase in size,
consider follow-up in 1 year. Kidneys are symmetrical. No
hydronephrosis or hydroureter. No renal, ureteral, or bladder
stones. Mild bladder wall thickening may indicate cystitis.

Stomach/Bowel: Stomach is within normal limits. Appendix appears
normal. No evidence of bowel wall thickening, distention, or
inflammatory changes.

Vascular/Lymphatic: No significant vascular findings are present. No
enlarged abdominal or pelvic lymph nodes.

Reproductive: Uterus and bilateral adnexa are unremarkable.

Other: No abdominal wall hernia or abnormality. No abdominopelvic
ascites.

Musculoskeletal: No acute or significant osseous findings.
IMPRESSION: 1. No renal or ureteral stone or obstruction.
2. Mild bladder wall thickening may indicate cystitis.
3. Interval enlargement of a 3.6 cm left adrenal gland fat
containing nodule consistent with adenoma. Suggest follow-up in 1
year. This recommendation follows ACR consensus guidelines: Managing
Incidental Findings on Abdominal CT: White Paper of the ACR
Incidental Findings Committee. [HOSPITAL] 2101;[DATE].
4. Status post cholecystectomy. No biliary dilatation.

## 2020-06-14 ENCOUNTER — Other Ambulatory Visit: Payer: Self-pay

## 2020-06-14 ENCOUNTER — Emergency Department: Payer: Self-pay

## 2020-06-14 ENCOUNTER — Emergency Department
Admission: EM | Admit: 2020-06-14 | Discharge: 2020-06-14 | Disposition: A | Discharge status: Home / Self Care | Payer: Self-pay | Attending: Emergency Medicine | Admitting: Emergency Medicine

## 2020-06-14 DIAGNOSIS — F17210 Nicotine dependence, cigarettes, uncomplicated: Secondary | ICD-10-CM | POA: Insufficient documentation

## 2020-06-14 DIAGNOSIS — K029 Dental caries, unspecified: Secondary | ICD-10-CM | POA: Insufficient documentation

## 2020-06-14 DIAGNOSIS — H9201 Otalgia, right ear: Secondary | ICD-10-CM | POA: Insufficient documentation

## 2020-06-14 DIAGNOSIS — G500 Trigeminal neuralgia: Secondary | ICD-10-CM | POA: Insufficient documentation

## 2020-06-14 MED ORDER — GABAPENTIN 300 MG PO CAPS: 300.0000 mg | ORAL_CAPSULE | Freq: Three times a day (TID) | ORAL | 2 refills | Status: DC

## 2020-06-14 MED ORDER — OXYCODONE-ACETAMINOPHEN 5-325 MG PO TABS
1.0000 | ORAL_TABLET | Freq: Once | ORAL | Status: AC
  Administered 2020-06-14: 1 via ORAL
  Filled 2020-06-14: qty 1

## 2020-06-14 MED ORDER — OXYCODONE-ACETAMINOPHEN 5-325 MG PO TABS: 1.0000 | ORAL_TABLET | Freq: Four times a day (QID) | ORAL | 0 refills | Status: AC | PRN

## 2020-06-14 MED ORDER — PREDNISONE 10 MG (21) PO TBPK: ORAL_TABLET | ORAL | 0 refills | Status: AC

## 2020-06-14 NOTE — ED Triage Notes (Signed)
Pt has multiple complaints. Pt states it started with ear pain that started in December, pt states that the pain then travelled to her neck and jaw. Pt states she has a hx of ear infections. Pt states she just can't stand it any more. Pt states she take ibuprofen that takes the pain away but it comes right back. Denies dental pain or any dental abscessed. Pt is A&Ox4 and NAD/.

## 2020-06-14 NOTE — ED Provider Notes (Signed)
[facility] Emergency Department Provider Note  ____________________________________________   Event Date/Time   First MD Initiated Contact with Patient 06/14/20 1842     (approximate)  I have reviewed the triage vital signs and the nursing notes.   HISTORY  Chief Complaint Jaw Pain and Otalgia    HPI Leslie Moyer is a 35 y.o. female presents emergency department complaining of right-sided ear pain that has been intermittent since December along with jaw pain.  States the pain is mostly behind the right ear.  She denies any fever or chills.  States she had a tooth extracted on the left side last week.  Denies dental pain at this time    Past Medical History:  Diagnosis Date  . Depression    X 1-WAS AN ISOLATED EVENT PER PT  . Gallstones 08/2016  . GERD (gastroesophageal reflux disease)   . Psoriasis     Patient Active Problem List   Diagnosis Date Noted  . Calculus of gallbladder with chronic cholecystitis without obstruction   . RUQ pain     Past Surgical History:  Procedure Laterality Date  . CHOLECYSTECTOMY N/A 08/28/2016   Procedure: LAPAROSCOPIC CHOLECYSTECTOMY;  Surgeon: Leafy Ro, MD;  Location: [facility] ORS;  Service: General;  Laterality: N/A;  . MOUTH SURGERY     ALL UPPER TEETH REMOVED-WAS NOT PUT TO SLEEP FOR THIS    Prior to Admission medications   Medication Sig Start Date End Date Taking? Authorizing Provider  gabapentin (NEURONTIN) 300 MG capsule Take 1 capsule (300 mg total) by mouth 3 (three) times daily. 06/14/20 06/14/21 Yes Gerrett Loman, Roselyn Bering, PA-C  oxyCODONE-acetaminophen (PERCOCET) 5-325 MG tablet Take 1 tablet by mouth every 6 (six) hours as needed for severe pain. 06/14/20 06/14/21 Yes Jshon Ibe, Roselyn Bering, PA-C  predniSONE (STERAPRED UNI-PAK 21 TAB) 10 MG (21) TBPK tablet Take 6 pills on day one then decrease by 1 pill each day 06/14/20  Yes Aviv Lengacher, Roselyn Bering, PA-C  acetaminophen (TYLENOL) 500 MG tablet Take 1,000 mg by mouth 2  (two) times daily as needed for headache.    [provider]  calcium carbonate (TUMS - DOSED IN MG ELEMENTAL CALCIUM) 500 MG chewable tablet Chew 2 tablets by mouth daily as needed for indigestion or heartburn.    [provider]  HYDROcodone-acetaminophen (NORCO/VICODIN) 5-325 MG tablet Take 1-2 tablets by mouth every 4 (four) hours as needed for moderate pain. 08/28/16   Pabon, Diego F, MD  ibuprofen (ADVIL,MOTRIN) 200 MG tablet Take 800 mg by mouth daily as needed for moderate pain.    [provider]  nystatin (MYCOSTATIN/NYSTOP) powder Apply topically 4 (four) times daily as needed. 09/12/16   Piscoya, Elita Quick, MD  Pseudoephedrine-Ibuprofen 30-200 MG TABS Take 1 tablet by mouth daily as needed (allergies).    [provider]    Allergies Onion  Family History  Problem Relation Age of Onset  . Breast cancer Maternal Grandmother   . Crohn's disease Mother     Social History Social History   Tobacco Use  . Smoking status: Current Every Day Smoker    Packs/day: 0.50    Years: 13.00    Pack years: 6.50    Types: Cigarettes  . Smokeless tobacco: Never Used  Vaping Use  . Vaping Use: Never used  Substance Use Topics  . Alcohol use: Yes    Comment: rarely  . Drug use: No    Review of Systems  Constitutional: No fever/chills Eyes: No visual changes.  ENT: No sore throat.  Positive for right ear and jaw pain Respiratory: Denies cough Cardiovascular: Denies chest pain Gastrointestinal: Denies abdominal pain Genitourinary: Negative for dysuria. Musculoskeletal: Negative for back pain. Skin: Negative for rash. Psychiatric: no mood changes,     ____________________________________________   PHYSICAL EXAM:  VITAL SIGNS: ED Triage Vitals  Enc Vitals Group     BP 06/14/20 1738 (!) 156/115     Pulse Rate 06/14/20 1738 (!) 113     Resp 06/14/20 1738 20     Temp 06/14/20 1738 98.6 F (37 C)     Temp Source 06/14/20 1738 Oral     SpO2  06/14/20 1738 98 %     Weight 06/14/20 1739 210 lb (95.3 kg)     Height 06/14/20 1739 5\' 6"  (1.676 m)     Head Circumference --      Peak Flow --      Pain Score 06/14/20 1739 8     Pain Loc --      Pain Edu? --      Excl. in GC? --     Constitutional: Alert and oriented. Well appearing and in no acute distress. Eyes: Conjunctivae are normal.  Head: Atraumatic. Ears: TMs are clear bilaterally Nose: No congestion/rhinnorhea. Mouth/Throat: Mucous membranes are moist.  Molar is broken in half, no redness or swelling  Neck:  supple no lymphadenopathy noted Cardiovascular: Normal rate, regular rhythm. Heart sounds are normal Respiratory: Normal respiratory effort.  No retractions, lungs c t a  GU: deferred Musculoskeletal: FROM all extremities, warm and well perfused Neurologic:  Normal speech and language.  Skin:  Skin is warm, dry and intact. No rash noted. Psychiatric: Mood and affect are normal. Speech and behavior are normal.  ____________________________________________   LABS (all labs ordered are listed, but only abnormal results are displayed)  Labs Reviewed - No data to display ____________________________________________   ____________________________________________  RADIOLOGY  CT maxillofacial  ____________________________________________   PROCEDURES  Procedure(s) performed: No  Procedures    ____________________________________________   INITIAL IMPRESSION / ASSESSMENT AND PLAN / ED COURSE  Pertinent labs & imaging results that were available during my care of the patient were reviewed by me and considered in my medical decision making (see chart for details).   The patient is a 35 year old female presents with continued right ear pain.  See HPI.  Physical exam shows patient to appear stable.  CT maxillofacial  Ct reviewed by me and confirmed by radiology to have no acute abnormality  Discussed everything with the patient  Concerns for  trigeminal neuralgia,  Gave the patient sterapred, gabapentin, and percocet for pain, she is to return if worsening, f/u with her regular doctor    Leslie Moyer was evaluated in Emergency Department on 06/14/2020 for the symptoms described in the history of present illness. She was evaluated in the context of the global COVID-19 pandemic, which necessitated consideration that the patient might be at risk for infection with the SARS-CoV-2 virus that causes COVID-19. Institutional protocols and algorithms that pertain to the evaluation of patients at risk for COVID-19 are in a state of rapid change based on information released by regulatory bodies including the CDC and federal and state organizations. These policies and algorithms were followed during the patient's care in the ED.    As part of my medical decision making, I reviewed the following data within the electronic MEDICAL RECORD NUMBER Nursing notes reviewed and incorporated, Old chart reviewed, Radiograph reviewed , Notes from  prior ED visits and [place] Controlled Substance Database  ____________________________________________   FINAL CLINICAL IMPRESSION(S) / ED DIAGNOSES  Final diagnoses:  Trigeminal neuralgia of right side of face  Dental caries  Ear pain, right      NEW MEDICATIONS STARTED DURING THIS VISIT:  New Prescriptions   GABAPENTIN (NEURONTIN) 300 MG CAPSULE    Take 1 capsule (300 mg total) by mouth 3 (three) times daily.   OXYCODONE-ACETAMINOPHEN (PERCOCET) 5-325 MG TABLET    Take 1 tablet by mouth every 6 (six) hours as needed for severe pain.   PREDNISONE (STERAPRED UNI-PAK 21 TAB) 10 MG (21) TBPK TABLET    Take 6 pills on day one then decrease by 1 pill each day     Note:  This document was prepared using Dragon voice recognition software and may include unintentional dictation errors.    Faythe Ghee, PA-C 06/14/20 Hayden Rasmussen, MD 06/15/20 [phone]

## 2020-06-14 NOTE — Discharge Instructions (Signed)
Follow up with your dentist or regular doctor if not better in 5 days Take the medication as prescribed Start off by taking the gabapentin once daily and slowly increase to 2 or 3 times a day Return to the ER if worsening

## 2020-07-26 ENCOUNTER — Encounter: Payer: Self-pay | Admitting: Emergency Medicine

## 2020-07-26 ENCOUNTER — Other Ambulatory Visit: Payer: Self-pay

## 2020-07-26 ENCOUNTER — Emergency Department
Admission: EM | Admit: 2020-07-26 | Discharge: 2020-07-26 | Disposition: A | Discharge status: Home / Self Care | Payer: Self-pay | Attending: Emergency Medicine | Admitting: Emergency Medicine

## 2020-07-26 DIAGNOSIS — K0889 Other specified disorders of teeth and supporting structures: Secondary | ICD-10-CM

## 2020-07-26 DIAGNOSIS — F17210 Nicotine dependence, cigarettes, uncomplicated: Secondary | ICD-10-CM | POA: Insufficient documentation

## 2020-07-26 DIAGNOSIS — K047 Periapical abscess without sinus: Secondary | ICD-10-CM | POA: Insufficient documentation

## 2020-07-26 MED ORDER — PENICILLIN V POTASSIUM 500 MG PO TABS
500.0000 mg | ORAL_TABLET | Freq: Once | ORAL | Status: AC
  Administered 2020-07-26: 500 mg via ORAL
  Filled 2020-07-26: qty 1

## 2020-07-26 MED ORDER — PENICILLIN V POTASSIUM 500 MG PO TABS: 500.0000 mg | ORAL_TABLET | Freq: Four times a day (QID) | ORAL | 0 refills | Status: AC

## 2020-07-26 MED ORDER — TRAMADOL HCL 50 MG PO TABS: 50.0000 mg | ORAL_TABLET | Freq: Four times a day (QID) | ORAL | 0 refills | Status: AC | PRN

## 2020-07-26 NOTE — ED Triage Notes (Signed)
Pt comes into the eD via POV c/o left lower dental pain that has been ongoing x 1-2 weeks.  Pt in NAD at this time.

## 2020-07-26 NOTE — ED Provider Notes (Signed)
[facility] Emergency Department Provider Note  Time seen: 2:53 PM  I have reviewed the triage vital signs and the nursing notes.   HISTORY  Chief Complaint Dental Pain   HPI Leslie Moyer is a 35 y.o. female with a past medical history of gastric reflux, gallstones, depression, presents to the emergency department for left lower dental pain.  According to the patient for the past week or so she has been experiencing pain in her left lower molars.  Patient states she is trying to get in with a dentist that she knows but has not been able to yet.  Patient denies any fever.  States she has had dental infections before similar to today's presentation.   Past Medical History:  Diagnosis Date  . Depression    X 1-WAS AN ISOLATED EVENT PER PT  . Gallstones 08/2016  . GERD (gastroesophageal reflux disease)   . Psoriasis     Patient Active Problem List   Diagnosis Date Noted  . Calculus of gallbladder with chronic cholecystitis without obstruction   . RUQ pain     Past Surgical History:  Procedure Laterality Date  . CHOLECYSTECTOMY N/A 08/28/2016   Procedure: LAPAROSCOPIC CHOLECYSTECTOMY;  Surgeon: Leafy Ro, MD;  Location: [facility] ORS;  Service: General;  Laterality: N/A;  . MOUTH SURGERY     ALL UPPER TEETH REMOVED-WAS NOT PUT TO SLEEP FOR THIS    Prior to Admission medications   Medication Sig Start Date End Date Taking? Authorizing Provider  acetaminophen (TYLENOL) 500 MG tablet Take 1,000 mg by mouth 2 (two) times daily as needed for headache.    [provider]  calcium carbonate (TUMS - DOSED IN MG ELEMENTAL CALCIUM) 500 MG chewable tablet Chew 2 tablets by mouth daily as needed for indigestion or heartburn.    [provider]  gabapentin (NEURONTIN) 300 MG capsule Take 1 capsule (300 mg total) by mouth 3 (three) times daily. 06/14/20 06/14/21  Fisher, Roselyn Bering, PA-C  HYDROcodone-acetaminophen (NORCO/VICODIN) 5-325 MG tablet Take 1-2  tablets by mouth every 4 (four) hours as needed for moderate pain. 08/28/16   Pabon, Diego F, MD  ibuprofen (ADVIL,MOTRIN) 200 MG tablet Take 800 mg by mouth daily as needed for moderate pain.    [provider]  nystatin (MYCOSTATIN/NYSTOP) powder Apply topically 4 (four) times daily as needed. 09/12/16   Henrene Dodge, MD  oxyCODONE-acetaminophen (PERCOCET) 5-325 MG tablet Take 1 tablet by mouth every 6 (six) hours as needed for severe pain. 06/14/20 06/14/21  Fisher, Roselyn Bering, PA-C  predniSONE (STERAPRED UNI-PAK 21 TAB) 10 MG (21) TBPK tablet Take 6 pills on day one then decrease by 1 pill each day 06/14/20   Faythe Ghee, PA-C  Pseudoephedrine-Ibuprofen 30-200 MG TABS Take 1 tablet by mouth daily as needed (allergies).    [provider]    Allergies  Allergen Reactions  . Onion Anaphylaxis and Shortness Of Breath    Family History  Problem Relation Age of Onset  . Breast cancer Maternal Grandmother   . Crohn's disease Mother     Social History Social History   Tobacco Use  . Smoking status: Current Every Day Smoker    Packs/day: 0.50    Years: 13.00    Pack years: 6.50    Types: Cigarettes  . Smokeless tobacco: Never Used  Vaping Use  . Vaping Use: Never used  Substance Use Topics  . Alcohol use: Yes    Comment: rarely  . Drug  use: No    Review of Systems Constitutional: Negative for fever. ENT: Left lower dental pain Cardiovascular: Negative for chest pain. Respiratory: Negative for shortness of breath. Gastrointestinal: Negative for abdominal pain, vomiting Musculoskeletal: Negative for musculoskeletal complaints Neurological: Negative for headache All other ROS negative  ____________________________________________   PHYSICAL EXAM:  VITAL SIGNS: ED Triage Vitals  Enc Vitals Group     BP 07/26/20 1231 (!) 140/92     Pulse Rate 07/26/20 1231 (!) 103     Resp 07/26/20 1231 18     Temp 07/26/20 1231 98.4 F (36.9 C)     Temp Source 07/26/20  1231 Oral     SpO2 07/26/20 1231 99 %     Weight 07/26/20 1232 230 lb (104.3 kg)     Height 07/26/20 1232 5\' 6"  (1.676 m)     Head Circumference --      Peak Flow --      Pain Score 07/26/20 1232 7     Pain Loc --      Pain Edu? --      Excl. in GC? --    Constitutional: Alert and oriented. Well appearing and in no distress. Eyes: Normal exam ENT      Head: Normocephalic and atraumatic.      Mouth/Throat: Mucous membranes are moist.  Overall poor dentition with several decayed and fractured teeth.  Does have tenderness to palpation over the left lower molar gums but no signs of abscess. Cardiovascular: Normal rate, regular rhythm.  Respiratory: Normal respiratory effort without tachypnea nor retractions. Breath sounds are clear Gastrointestinal: Soft and nontender. No distention.  Musculoskeletal: Nontender with normal range of motion in all extremities.  Neurologic:  Normal speech and language. No gross focal neurologic deficits  Skin:  Skin is warm, dry and intact.  Psychiatric: Mood and affect are normal.   ____________________________________________   INITIAL IMPRESSION / ASSESSMENT AND PLAN / ED COURSE  Pertinent labs & imaging results that were available during my care of the patient were reviewed by me and considered in my medical decision making (see chart for details).   Patient presents emergency department for pain to her left lower molars.  The area does have decayed and fractured teeth but no signs of any obvious abscess, no fluctuance.  We will place the patient on penicillin and tramadol have the patient follow-up with her dentist.  Patient agreeable to plan.  Leslie Moyer was evaluated in Emergency Department on 07/26/2020 for the symptoms described in the history of present illness. She was evaluated in the context of the global COVID-19 pandemic, which necessitated consideration that the patient might be at risk for infection with the SARS-CoV-2 virus that  causes COVID-19. Institutional protocols and algorithms that pertain to the evaluation of patients at risk for COVID-19 are in a state of rapid change based on information released by regulatory bodies including the CDC and federal and state organizations. These policies and algorithms were followed during the patient's care in the ED.  ____________________________________________   FINAL CLINICAL IMPRESSION(S) / ED DIAGNOSES  Dental infection   07/28/2020, MD 07/26/20 1455

## 2020-07-26 NOTE — Discharge Instructions (Addendum)
OPTIONS FOR DENTAL FOLLOW UP CARE ° °[place] [facility] Health and Human Services - Local Safety Net Dental Clinics °http://www.ncdhhs.gov/dph/oralhealth/services/safetynetclinics.htm °  °[facility] (336-562-3123) ° °[facility] ([phone]) ° °[facility] ([phone] ext [phone]) ° °[facility] ([phone]) ° °[facility] ([phone]) °This clinic caters to the indigent population and is on a lottery system. °Location: °[facility], [address], [place] °Clinic Hours: °Wednesdays from 6pm - 9pm, patients seen by a lottery system. °For dates, call or go to [email or web address].[facility].edu/[facility]/patients/Dental-[facility] °Services: °Cleanings, fillings and simple extractions. °Payment Options: °DENTAL WORK IS FREE OF CHARGE. Bring proof of income or support. °Best way to get seen: °Arrive at 5:15 pm - this is a lottery, NOT first come/first serve, so arriving earlier will not increase your chances of being seen. °  °  °[facility] °[phone] °Select option 1 for emergencies °  °Location: °[facility], [address], [place] °Clinic Hours: °No walk-ins accepted - call the day before to schedule an appointment. °Check in times are 9:30 am and 1:30 pm. °Services: °Simple extractions, temporary fillings, pulpectomy/pulp debridement, uncomplicated abscess drainage. °Payment Options: °PAYMENT IS DUE AT THE TIME OF SERVICE.  Fee is usually $100-200, additional surgical procedures (e.g. abscess drainage) may be extra. °Cash, checks, Visa/MasterCard accepted.  Can file Medicaid if patient is covered for dental - patient should call case worker to check. °No discount for [facility] patients. °Best way to get seen: °MUST call the day before and get onto the schedule. Can usually be seen the next 1-2 days. No walk-ins accepted. °  °  °[facility] °[phone] °   °Location: °[facility], [address], [place] °Clinic Hours: °M, W, Th, F 8am or 1:30pm, Tues 9a or 1:30 - first come/first served. °Services: °Simple extractions, temporary fillings, uncomplicated abscess drainage.  You do not need to be an [place] resident. °Payment Options: °PAYMENT IS DUE AT THE TIME OF SERVICE. °Dental insurance, otherwise sliding scale - bring proof of income or support. °Depending on income and treatment needed, cost is usually $50-200. °Best way to get seen: °Arrive early as it is first come/first served. °  °  °[facility] °[phone] °  °Location: °[address] °Clinic Hours: °Mon-Thu 8a-5p °Services: °Most basic dental services including extractions and fillings. °Payment Options: °PAYMENT IS DUE AT THE TIME OF SERVICE. °Sliding scale, up to 50% off - bring proof if income or support. °Medicaid with dental option accepted. °Best way to get seen: °Call to schedule an appointment, can usually be seen within 2 weeks OR they will try to see walk-ins - show up at 8a or 2p (you may have to wait). °  °  °[facility] °[phone] °[place] RESIDENTS ONLY °  °Location: °[facility], [address], [place], [place] [address] °Clinic Hours: By appointment only. °Monday - Thursday 8am-5pm, Friday 8am-12pm °Services: Cleanings, fillings, extractions. °Payment Options: °PAYMENT IS DUE AT THE TIME OF SERVICE. °Cash, Visa or MasterCard. Sliding scale - $30 minimum per service. °Best way to get seen: °Come in to office, complete packet and make an appointment - need proof of income °or support monies for each household member and proof of [place] residence. °Usually takes about a month to get in. °  °  °[facility] °[phone] °  °Location: °[address]   [place] °Clinic Hours: Walk-in Urgent Care Dental Services are offered Monday-Friday  mornings only. °The numbers of emergencies accepted daily is limited to the number of °providers available. °Maximum 15 - Mondays, Wednesdays & Thursdays °Maximum 10 - Tuesdays & Fridays °Services: °You do not need to be a [place] resident to be seen for a dental emergency. °Emergencies are defined as pain, swelling, abnormal bleeding, or dental trauma. Walkins will receive x-rays if needed. °NOTE: Dental cleaning is not an emergency. °Payment Options: °PAYMENT IS DUE AT THE TIME OF SERVICE. °Minimum co-pay is $40.00 for uninsured patients. °Minimum co-pay is $3.00 for Medicaid with dental coverage. °Dental Insurance is accepted and must be presented at time of visit. °Medicare does not cover dental. °Forms of payment: Cash, credit card, checks. °Best way to get seen: °If not previously registered with the clinic, walk-in dental registration begins at 7:15 am and is on a first come/first serve basis. °If previously registered with the clinic, call to make an appointment. °  °  °[facility] °[phone] °[place] RESIDENTS ONLY °  °Location: °[address], [place], [place] °Clinic Hours: °Mon-Thu 10a-2p °Services: Extractions only! °Payment Options: °FREE (donations accepted) - bring proof of income or support °Best way to get seen: °Call and schedule an appointment OR come at 8am on the 1st Monday of every month (except for holidays) when it is first come/first served. °  °  °[facility] °[phone] °  °Location: °[address], [place] °Clinic Hours: °Friday mornings °Services, Payment Options, Best way to get seen: °Call for info °

## 2022-03-18 ENCOUNTER — Emergency Department
Admission: EM | Admit: 2022-03-18 | Discharge: 2022-03-19 | Discharge status: Against Medical Advice | Payer: Self-pay | Attending: Emergency Medicine | Admitting: Emergency Medicine

## 2022-03-18 DIAGNOSIS — K047 Periapical abscess without sinus: Secondary | ICD-10-CM | POA: Insufficient documentation

## 2022-03-18 DIAGNOSIS — Z5321 Procedure and treatment not carried out due to patient leaving prior to being seen by health care provider: Secondary | ICD-10-CM | POA: Insufficient documentation

## 2022-03-18 NOTE — ED Triage Notes (Signed)
Ambulatory to triage with steady gait with c/o dental/facial pain to right side. Pt also reports edema to right side of face.  Hx of trigeminal neuralgia.  Recovering from Covid and unsure if facial pain Is due to irritated nerves or if issue is due to dental issues.  Provider in triage for further eval at this time.

## 2022-03-18 NOTE — ED Provider Triage Note (Signed)
Emergency Medicine Provider Triage Evaluation Note  Leslie Moyer , a 37 y.o. female  was evaluated in triage.  Pt complains of right side facial pain/ear pain and swelling to left neck/jaw. Covid positive on 1/1, but thought she was getting better. History of trigeminal neuralgia and pain feels similar however she also has broken teeth on the right upper and is not sure if she has dental abscess that may be causing pain.  She has taken gabapentin and hydrocodone without relief..  Physical Exam  BP (!) 146/121 (BP Location: Left Arm)   Pulse 73   Temp 99.1 F (37.3 C) (Oral)   Resp 18   SpO2 99%  Gen:   Awake, no distress   Resp:  Normal effort  MSK:   Moves extremities without difficulty  Other:    Medical Decision Making  Medically screening exam initiated at 9:46 PM.  Appropriate orders placed.  Leslie Moyer was informed that the remainder of the evaluation will be completed by another provider, this initial triage assessment does not replace that evaluation, and the importance of remaining in the ED until their evaluation is complete.   Victorino Dike, FNP 03/18/22 2148

## 2022-06-15 ENCOUNTER — Other Ambulatory Visit: Payer: Self-pay | Admitting: Nurse Practitioner
